# Patient Record
Sex: Male | Born: 1970 | Race: White | Hispanic: No | Marital: Married | Smoking: Former smoker
Health system: Southern US, Community
[De-identification: ages and names within clinical notes are randomized; demographics above are authoritative.]

## PROBLEM LIST (undated history)

## (undated) DIAGNOSIS — F32A Depression, unspecified: Secondary | ICD-10-CM

## (undated) DIAGNOSIS — M542 Cervicalgia: Secondary | ICD-10-CM

## (undated) DIAGNOSIS — M549 Dorsalgia, unspecified: Secondary | ICD-10-CM

## (undated) DIAGNOSIS — M1990 Unspecified osteoarthritis, unspecified site: Secondary | ICD-10-CM

## (undated) DIAGNOSIS — I10 Essential (primary) hypertension: Secondary | ICD-10-CM

## (undated) HISTORY — DX: Depression, unspecified: F32.A

## (undated) HISTORY — PX: NECK SURGERY: SHX720

---
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
## 1999-10-18 ENCOUNTER — Encounter: Payer: Self-pay | Admitting: Neurosurgery

## 1999-10-18 ENCOUNTER — Inpatient Hospital Stay (HOSPITAL_COMMUNITY): Admission: EM | Admit: 1999-10-18 | Discharge: 1999-10-22 | Payer: Self-pay | Admitting: Emergency Medicine

## 1999-10-18 ENCOUNTER — Encounter: Payer: Self-pay | Admitting: Emergency Medicine

## 1999-10-19 ENCOUNTER — Encounter: Payer: Self-pay | Admitting: Neurosurgery

## 2000-03-11 ENCOUNTER — Emergency Department (HOSPITAL_COMMUNITY): Admission: EM | Admit: 2000-03-11 | Discharge: 2000-03-11 | Payer: Self-pay

## 2000-03-11 ENCOUNTER — Encounter: Payer: Self-pay | Admitting: Emergency Medicine

## 2000-03-12 ENCOUNTER — Encounter: Payer: Self-pay | Admitting: Emergency Medicine

## 2003-02-10 ENCOUNTER — Emergency Department (HOSPITAL_COMMUNITY): Admission: EM | Admit: 2003-02-10 | Discharge: 2003-02-10 | Payer: Self-pay

## 2003-02-17 ENCOUNTER — Emergency Department (HOSPITAL_COMMUNITY): Admission: EM | Admit: 2003-02-17 | Discharge: 2003-02-17 | Payer: Self-pay | Admitting: Emergency Medicine

## 2003-11-14 ENCOUNTER — Emergency Department (HOSPITAL_COMMUNITY): Admission: AD | Admit: 2003-11-14 | Discharge: 2003-11-14 | Payer: Self-pay | Admitting: Family Medicine

## 2004-04-06 ENCOUNTER — Ambulatory Visit (HOSPITAL_COMMUNITY): Admission: RE | Admit: 2004-04-06 | Discharge: 2004-04-06 | Payer: Self-pay | Admitting: Neurosurgery

## 2004-10-03 ENCOUNTER — Ambulatory Visit: Payer: Self-pay | Admitting: Family Medicine

## 2004-10-08 ENCOUNTER — Encounter: Admission: RE | Admit: 2004-10-08 | Discharge: 2004-10-08 | Payer: Self-pay | Admitting: Family Medicine

## 2004-11-05 ENCOUNTER — Ambulatory Visit: Payer: Self-pay | Admitting: Family Medicine

## 2004-12-03 ENCOUNTER — Ambulatory Visit: Payer: Self-pay | Admitting: Family Medicine

## 2004-12-31 ENCOUNTER — Ambulatory Visit: Payer: Self-pay | Admitting: Family Medicine

## 2005-01-14 ENCOUNTER — Ambulatory Visit: Payer: Self-pay | Admitting: Family Medicine

## 2005-01-23 ENCOUNTER — Ambulatory Visit: Payer: Self-pay | Admitting: Family Medicine

## 2005-02-04 ENCOUNTER — Inpatient Hospital Stay (HOSPITAL_COMMUNITY): Admission: RE | Admit: 2005-02-04 | Discharge: 2005-02-07 | Payer: Self-pay | Admitting: Neurosurgery

## 2005-03-04 ENCOUNTER — Ambulatory Visit: Payer: Self-pay | Admitting: Family Medicine

## 2005-05-06 ENCOUNTER — Ambulatory Visit: Payer: Self-pay | Admitting: Family Medicine

## 2005-05-08 ENCOUNTER — Ambulatory Visit (HOSPITAL_COMMUNITY): Admission: RE | Admit: 2005-05-08 | Discharge: 2005-05-08 | Payer: Self-pay | Admitting: Family Medicine

## 2005-07-25 ENCOUNTER — Ambulatory Visit: Payer: Self-pay | Admitting: Family Medicine

## 2005-07-25 ENCOUNTER — Ambulatory Visit (HOSPITAL_COMMUNITY): Admission: RE | Admit: 2005-07-25 | Discharge: 2005-07-25 | Payer: Self-pay | Admitting: Family Medicine

## 2005-07-28 ENCOUNTER — Ambulatory Visit: Payer: Self-pay | Admitting: Family Medicine

## 2005-07-29 ENCOUNTER — Ambulatory Visit: Payer: Self-pay | Admitting: Sports Medicine

## 2005-08-05 ENCOUNTER — Ambulatory Visit: Payer: Self-pay | Admitting: Family Medicine

## 2005-08-07 ENCOUNTER — Ambulatory Visit (HOSPITAL_COMMUNITY): Admission: RE | Admit: 2005-08-07 | Discharge: 2005-08-07 | Payer: Self-pay | Admitting: Family Medicine

## 2005-09-25 ENCOUNTER — Ambulatory Visit: Payer: Self-pay | Admitting: Family Medicine

## 2005-10-30 ENCOUNTER — Ambulatory Visit: Payer: Self-pay | Admitting: Family Medicine

## 2005-11-10 ENCOUNTER — Emergency Department (HOSPITAL_COMMUNITY): Admission: EM | Admit: 2005-11-10 | Discharge: 2005-11-10 | Payer: Self-pay | Admitting: Emergency Medicine

## 2005-12-16 ENCOUNTER — Ambulatory Visit: Payer: Self-pay | Admitting: Family Medicine

## 2006-02-07 IMAGING — CR DG ANKLE COMPLETE 3+V*L*
3 series · 3 of 3 positions shown · non-contrast
Comparison: none

CLINICAL DATA: 34-year-old, with lateral ankle pain. 
 3-VIEW LEFT ANKLE:

[t ankle joint ap left]
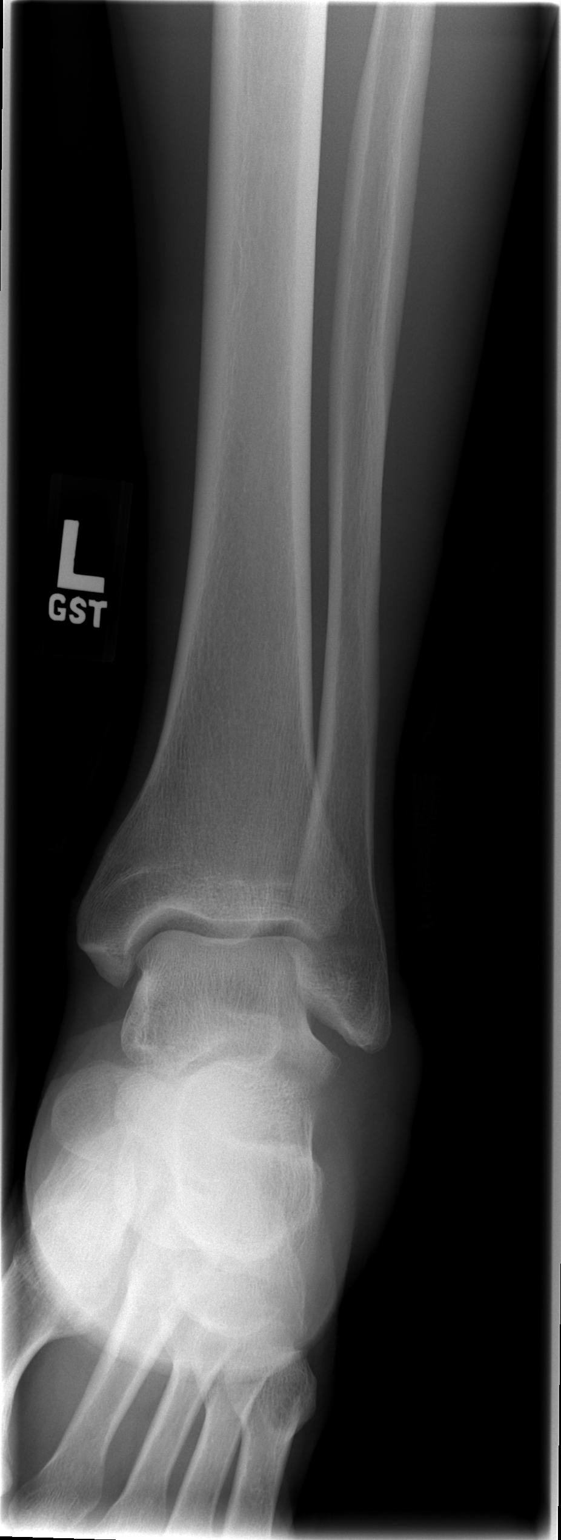

[t ankle joint oblique left]
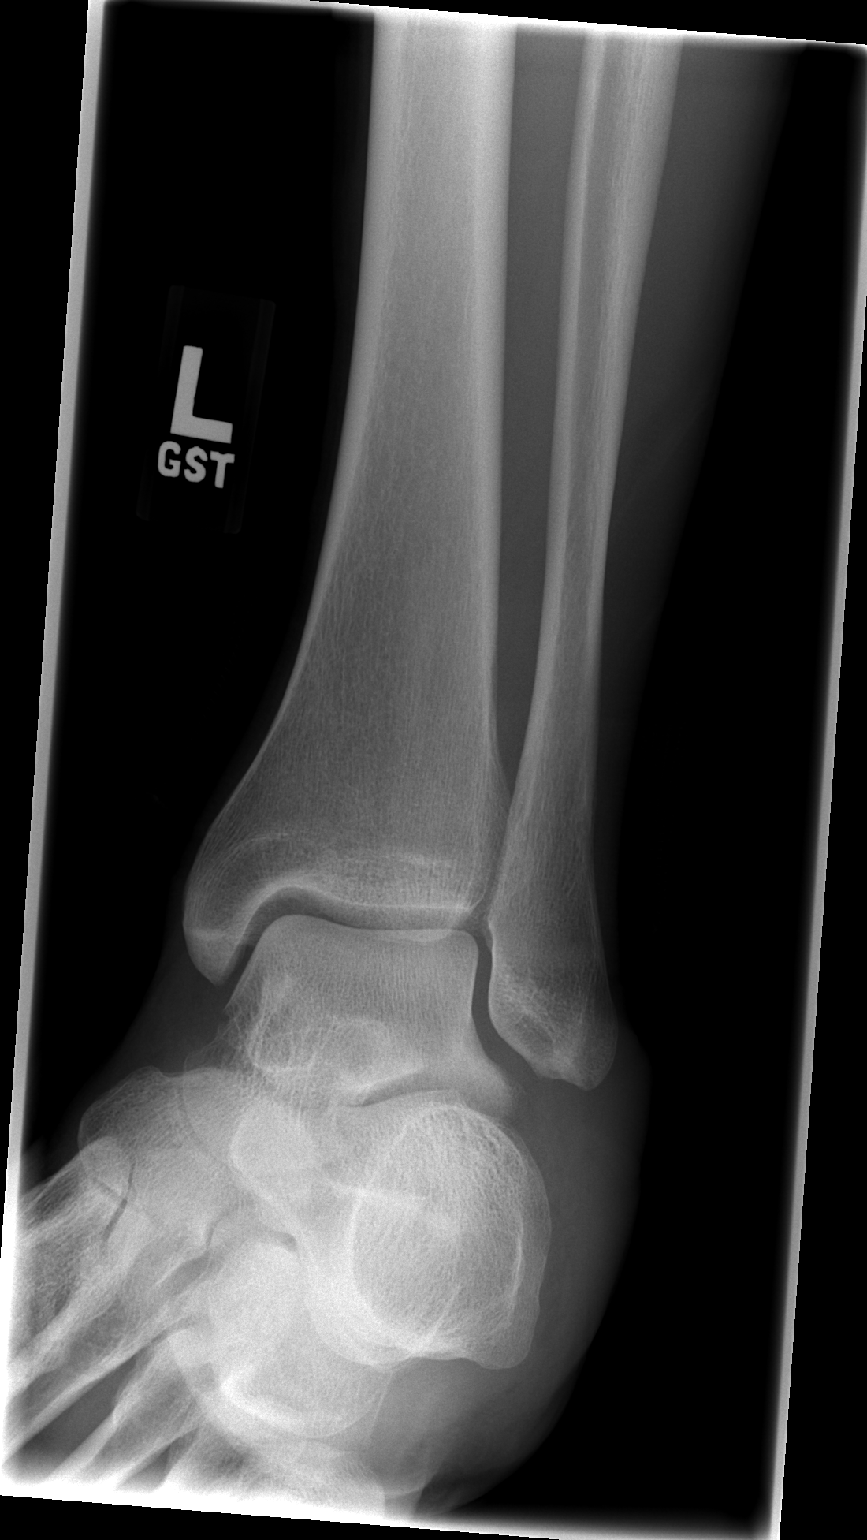

[t ankle joint lat left]
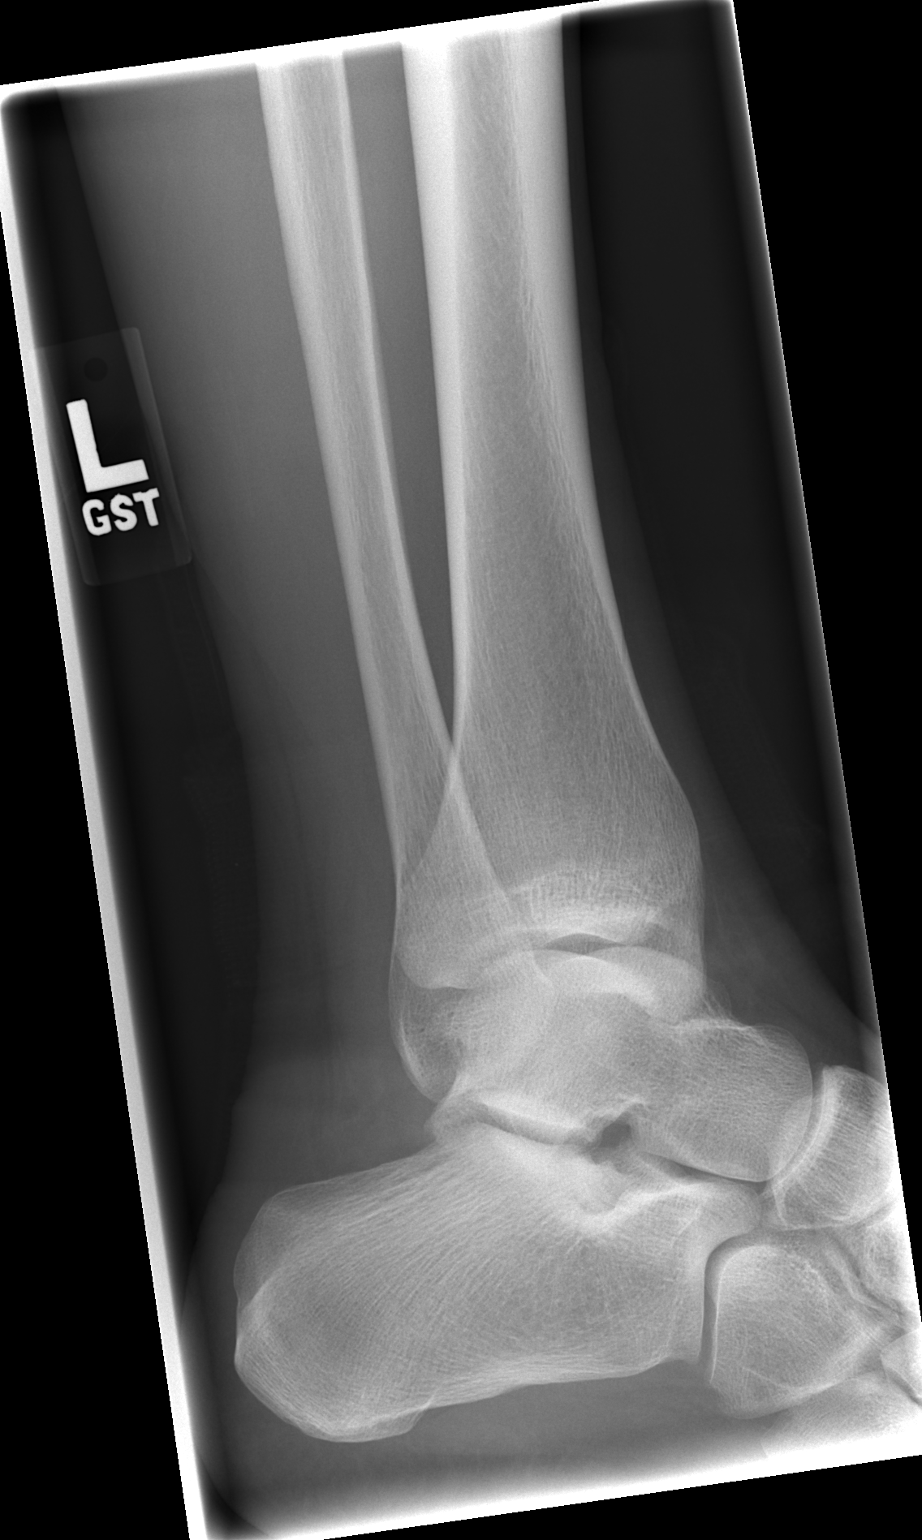

[3 of 3 positions shown; findings below may reference images not displayed]

FINDINGS: Ankle mortise is maintained.  No fractures are seen.
IMPRESSION: No acute bony findings.

## 2006-02-26 ENCOUNTER — Ambulatory Visit: Payer: Self-pay | Admitting: Family Medicine

## 2006-06-16 ENCOUNTER — Ambulatory Visit: Payer: Self-pay | Admitting: Sports Medicine

## 2006-06-30 ENCOUNTER — Ambulatory Visit: Payer: Self-pay | Admitting: Sports Medicine

## 2006-07-21 ENCOUNTER — Ambulatory Visit: Payer: Self-pay | Admitting: Family Medicine

## 2006-09-24 ENCOUNTER — Ambulatory Visit: Payer: Self-pay | Admitting: Family Medicine

## 2007-02-25 DIAGNOSIS — M479 Spondylosis, unspecified: Secondary | ICD-10-CM

## 2007-02-25 DIAGNOSIS — K219 Gastro-esophageal reflux disease without esophagitis: Secondary | ICD-10-CM

## 2007-02-25 DIAGNOSIS — F329 Major depressive disorder, single episode, unspecified: Secondary | ICD-10-CM

## 2007-02-25 DIAGNOSIS — F411 Generalized anxiety disorder: Secondary | ICD-10-CM | POA: Insufficient documentation

## 2007-02-25 DIAGNOSIS — G43909 Migraine, unspecified, not intractable, without status migrainosus: Secondary | ICD-10-CM

## 2007-02-25 HISTORY — DX: Spondylosis, unspecified: M47.9

## 2007-02-25 HISTORY — DX: Migraine, unspecified, not intractable, without status migrainosus: G43.909

## 2007-02-25 HISTORY — DX: Gastro-esophageal reflux disease without esophagitis: K21.9

## 2007-04-13 ENCOUNTER — Ambulatory Visit: Payer: Self-pay | Admitting: Family Medicine

## 2007-04-13 DIAGNOSIS — L0293 Carbuncle, unspecified: Secondary | ICD-10-CM

## 2007-04-13 DIAGNOSIS — L0292 Furuncle, unspecified: Secondary | ICD-10-CM | POA: Insufficient documentation

## 2007-04-13 HISTORY — DX: Furuncle, unspecified: L02.92

## 2007-04-14 ENCOUNTER — Telehealth: Payer: Self-pay | Admitting: *Deleted

## 2007-11-22 ENCOUNTER — Ambulatory Visit: Payer: Self-pay | Admitting: Family Medicine

## 2007-12-06 ENCOUNTER — Emergency Department (HOSPITAL_COMMUNITY): Admission: EM | Admit: 2007-12-06 | Discharge: 2007-12-06 | Payer: Self-pay | Admitting: Emergency Medicine

## 2008-01-31 ENCOUNTER — Ambulatory Visit: Payer: Self-pay | Admitting: Family Medicine

## 2008-02-18 ENCOUNTER — Encounter: Payer: Self-pay | Admitting: Family Medicine

## 2008-05-04 ENCOUNTER — Encounter (INDEPENDENT_AMBULATORY_CARE_PROVIDER_SITE_OTHER): Payer: Self-pay | Admitting: *Deleted

## 2008-05-05 ENCOUNTER — Telehealth: Payer: Self-pay | Admitting: Family Medicine

## 2008-05-05 ENCOUNTER — Ambulatory Visit: Payer: Self-pay | Admitting: Family Medicine

## 2008-06-01 ENCOUNTER — Ambulatory Visit: Payer: Self-pay | Admitting: Family Medicine

## 2008-06-26 ENCOUNTER — Ambulatory Visit: Payer: Self-pay | Admitting: Family Medicine

## 2008-06-26 DIAGNOSIS — R221 Localized swelling, mass and lump, neck: Secondary | ICD-10-CM

## 2008-06-26 DIAGNOSIS — R220 Localized swelling, mass and lump, head: Secondary | ICD-10-CM

## 2008-07-12 ENCOUNTER — Emergency Department (HOSPITAL_COMMUNITY): Admission: EM | Admit: 2008-07-12 | Discharge: 2008-07-12 | Payer: Self-pay | Admitting: Emergency Medicine

## 2008-09-08 ENCOUNTER — Encounter: Payer: Self-pay | Admitting: Family Medicine

## 2008-10-26 ENCOUNTER — Telehealth: Payer: Self-pay | Admitting: Family Medicine

## 2009-04-27 ENCOUNTER — Emergency Department (HOSPITAL_COMMUNITY): Admission: EM | Admit: 2009-04-27 | Discharge: 2009-04-27 | Payer: Self-pay | Admitting: Emergency Medicine
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
## 2011-05-16 NOTE — Op Note (Signed)
NAMEJAYMESON, Rick [name]               ACCOUNT NO.:  [id number]   MEDICAL RECORD NO.:  [id number]          PATIENT TYPE:  INP   LOCATION:  2899                         FACILITY:  [facility]   PHYSICIAN:  Payton Doughty, M.D.      DATE OF BIRTH:  1971/10/10   DATE OF PROCEDURE:  02/04/2005  DATE OF DISCHARGE:                                 OPERATIVE REPORT   PREOPERATIVE DIAGNOSIS:  Nonunion, C6-7 spondylosis, C4-5, C5-6.   POSTOPERATIVE DIAGNOSIS:  Nonunion, C6-7 spondylosis, C4-5, C5-6.   OPERATIVE PROCEDURE:  C4-5, C5-6, C6-7 anterior cervical decompression and  fusion with a reflex Hybrid plate.   SERVICE:  Neurosurgery.   ANESTHESIA:  Endotracheal.   PREPARATION:  __________ alcohol wipe.   COMPLICATIONS:  None.   ASSISTANT:  Danae Orleans. Venetia Maxon, M.D.   DESCRIPTION OF OPERATION:  A 40 year old gentleman with severe cervical  spondylosis at C4-5 and C5-6 and a possible nonunion at C6-7.  He was taken  to the operating room, smoothly anesthetized and intubated, and placed  supine on the operating table in the Holter head traction.  Following shave,  prep, and drape in the usual sterile fashion, the skin was incised  paralleling the sternocleidomastoid muscle starting 2 fingerbreadths below  the carotid tubercle and extending in a cephalic direction approximately 8  cm.  The platysma was identified, elevated, divided, and undermined.  The  sternocleidomastoid was identified and medial dissection revealed the  carotid artery which we retracted laterally to the left.  Trachea and  esophagus were retracted laterally to the right.  The bones of the anterior  cervical spine were exposed.  The old scar was easily dissected off the  bone.  The esophagus was carefully protected and no injury to it was  incurred.  Marker was placed.  Intraoperative x-ray was taken to confirm  correctness of level.  On confirm of correctness of level, longus colli was  taken down bilaterally and the Shadowline  self-retaining retractor placed in  a transverse and cephalic caudal direction.  Diskectomy was then carried out  at C4-5 and C5-6 under gross observation.  The operating microscope was then  brought in.  We used microdissection technique to remove the remaining disk  at C4-5 and C5-6, removed the posterior longitudinal ligament and dissected  the lateral recesses and the neural foramina.  At C6-7, general curettage  and the [facility] pituitary forceps were used to remove the bone that had not  fused.  There was a fibrous union but no bony union.  The anterior epidural  space was encountered.  No CSF leak was incurred.  Bony osteophytes were  removed and the neuroforamen explored and found to be opened.  Then, 7 mm  bone grafts were fashioned for the patella allograft and tapped into place  at all three levels.  A three-level, cervical 57 mm plate was then placed.  Two screws were placed in C4, two in C5, two in C6, two in C7.  They were  self-drilling 12 mm screws.  Intraoperative x-ray showed good placement of  bone grafts, plate, and screws.  The wound was irrigated, hemostasis  ensured.  A 7 mm Jackson-Pratt drain was exited by a separate incision.  The  platysma was reapproximated with 0 Vicryl in interrupted fashion.  Subcutaneous tissues  were reapproximated with 0 Vicryl in interrupted fashion.  Skin was closed  with 4-0 Vicryl in a running subcuticular fashion.  Benzoin and Steri-Strips  were placed and made occlusive with Telfa and Op-Site, and the patient  placed in an Aspen collar and returned to the recovery room in good  condition.      MWR/MEDQ  D:  02/04/2005  T:  02/04/2005  Job:  [id number]

## 2011-07-24 ENCOUNTER — Emergency Department (HOSPITAL_COMMUNITY)
Admission: EM | Admit: 2011-07-24 | Discharge: 2011-07-24 | Disposition: A | Discharge status: Home / Self Care | Payer: Self-pay | Attending: Emergency Medicine | Admitting: Emergency Medicine

## 2011-07-24 ENCOUNTER — Emergency Department (HOSPITAL_COMMUNITY): Payer: Self-pay

## 2011-07-24 DIAGNOSIS — M546 Pain in thoracic spine: Secondary | ICD-10-CM | POA: Insufficient documentation

## 2011-07-24 DIAGNOSIS — M79609 Pain in unspecified limb: Secondary | ICD-10-CM | POA: Insufficient documentation

## 2011-07-24 DIAGNOSIS — M542 Cervicalgia: Secondary | ICD-10-CM | POA: Insufficient documentation

## 2011-07-24 DIAGNOSIS — X500XXA Overexertion from strenuous movement or load, initial encounter: Secondary | ICD-10-CM | POA: Insufficient documentation

## 2011-07-24 DIAGNOSIS — S139XXA Sprain of joints and ligaments of unspecified parts of neck, initial encounter: Secondary | ICD-10-CM | POA: Insufficient documentation

## 2011-08-01 ENCOUNTER — Emergency Department (HOSPITAL_COMMUNITY): Payer: Worker's Compensation

## 2011-08-01 ENCOUNTER — Emergency Department (HOSPITAL_COMMUNITY)
Admission: EM | Admit: 2011-08-01 | Discharge: 2011-08-01 | Disposition: A | Discharge status: Home / Self Care | Payer: Worker's Compensation | Attending: Emergency Medicine | Admitting: Emergency Medicine

## 2011-08-01 DIAGNOSIS — Y9289 Other specified places as the place of occurrence of the external cause: Secondary | ICD-10-CM | POA: Insufficient documentation

## 2011-08-01 DIAGNOSIS — M545 Low back pain, unspecified: Secondary | ICD-10-CM | POA: Insufficient documentation

## 2011-08-01 DIAGNOSIS — S01409A Unspecified open wound of unspecified cheek and temporomandibular area, initial encounter: Secondary | ICD-10-CM | POA: Insufficient documentation

## 2011-08-01 DIAGNOSIS — W1809XA Striking against other object with subsequent fall, initial encounter: Secondary | ICD-10-CM | POA: Insufficient documentation

## 2011-08-01 DIAGNOSIS — R51 Headache: Secondary | ICD-10-CM | POA: Insufficient documentation

## 2011-08-01 DIAGNOSIS — I10 Essential (primary) hypertension: Secondary | ICD-10-CM | POA: Insufficient documentation

## 2011-08-01 DIAGNOSIS — M542 Cervicalgia: Secondary | ICD-10-CM | POA: Insufficient documentation

## 2011-08-01 DIAGNOSIS — S0990XA Unspecified injury of head, initial encounter: Secondary | ICD-10-CM | POA: Insufficient documentation

## 2012-07-02 ENCOUNTER — Other Ambulatory Visit: Payer: Self-pay | Admitting: Family Medicine

## 2014-12-26 ENCOUNTER — Encounter (HOSPITAL_COMMUNITY): Payer: Self-pay | Admitting: *Deleted

## 2014-12-26 ENCOUNTER — Emergency Department (HOSPITAL_COMMUNITY): Payer: Self-pay

## 2014-12-26 ENCOUNTER — Emergency Department (HOSPITAL_COMMUNITY)
Admission: EM | Admit: 2014-12-26 | Discharge: 2014-12-26 | Disposition: A | Discharge status: Home / Self Care | Payer: Self-pay | Attending: Emergency Medicine | Admitting: Emergency Medicine

## 2014-12-26 DIAGNOSIS — T1490XA Injury, unspecified, initial encounter: Secondary | ICD-10-CM

## 2014-12-26 DIAGNOSIS — Z79899 Other long term (current) drug therapy: Secondary | ICD-10-CM | POA: Insufficient documentation

## 2014-12-26 DIAGNOSIS — L989 Disorder of the skin and subcutaneous tissue, unspecified: Secondary | ICD-10-CM | POA: Insufficient documentation

## 2014-12-26 DIAGNOSIS — Y9289 Other specified places as the place of occurrence of the external cause: Secondary | ICD-10-CM | POA: Insufficient documentation

## 2014-12-26 DIAGNOSIS — Z792 Long term (current) use of antibiotics: Secondary | ICD-10-CM | POA: Insufficient documentation

## 2014-12-26 DIAGNOSIS — Y998 Other external cause status: Secondary | ICD-10-CM | POA: Insufficient documentation

## 2014-12-26 DIAGNOSIS — S62307A Unspecified fracture of fifth metacarpal bone, left hand, initial encounter for closed fracture: Secondary | ICD-10-CM | POA: Insufficient documentation

## 2014-12-26 DIAGNOSIS — Z720 Tobacco use: Secondary | ICD-10-CM | POA: Insufficient documentation

## 2014-12-26 DIAGNOSIS — Y9389 Activity, other specified: Secondary | ICD-10-CM | POA: Insufficient documentation

## 2014-12-26 DIAGNOSIS — W228XXA Striking against or struck by other objects, initial encounter: Secondary | ICD-10-CM | POA: Insufficient documentation

## 2014-12-26 HISTORY — DX: Dorsalgia, unspecified: M54.9

## 2014-12-26 MED ORDER — HYDROCODONE-ACETAMINOPHEN 5-325 MG PO TABS
1.0000 | ORAL_TABLET | Freq: Once | ORAL | Status: AC
  Administered 2014-12-26: 1 via ORAL
  Filled 2014-12-26: qty 1

## 2014-12-26 MED ORDER — HYDROCODONE-ACETAMINOPHEN 5-325 MG PO TABS: 1.0000 | ORAL_TABLET | ORAL | Status: DC | PRN

## 2014-12-26 NOTE — Discharge Instructions (Signed)
Read the information below.  Use the prescribed medication as directed.  Please discuss all new medications with your pharmacist.  Do not take additional tylenol while taking the prescribed pain medication to avoid overdose.  You may return to the Emergency Department at any time for worsening condition or any new symptoms that concern you.  If you develop uncontrolled pain, weakness or numbness of the extremity, severe discoloration of the skin, or you are unable to move your fingers, return to the ER for a recheck.       Boxer's Fracture You have a break (fracture) of the fifth metacarpal bone. This is commonly called a boxer's fracture. This is the bone in the hand where the Probert finger attaches. The fracture is in the end of that bone, closest to the Lorah finger. It is usually caused when you hit an object with a clenched fist. Often, the knuckle is pushed down by the impact. Sometimes, the fracture rotates out of position. A boxer's fracture will usually heal within 6 weeks, if it is treated properly and protected from re-injury. Surgery is sometimes needed. A cast, splint, or bulky hand dressing may be used to protect and immobilize a boxer's fracture. Do not remove this device or dressing until your caregiver approves. Keep your hand elevated, and apply ice packs for 15-20 minutes every 2 hours, for the first 2 days. Elevation and ice help reduce swelling and relieve pain. See your caregiver, or an orthopedic specialist, for follow-up care within the next 10 days. This is to make sure your fracture is healing properly. Document Released: 12/15/2005 Document Revised: 03/08/2012 Document Reviewed: 06/04/2007 [facility] Patient Information 2015 [place], [place]. This information is not intended to replace advice given to you by your health care provider. Make sure you discuss any questions you have with your health care provider.  Cast Care The purpose of a cast is to protect and immobilize an  injured part of the body. This may be necessary after fractures, surgery, or other injuries. Splints are another form of immobilization; however, they are usually not as rigid as a cast, which accommodates swelling of the injury while still maintaining immobilization. Splints are typically used in the immediate post injury or postoperative period, before changing to a cast.  Before the rigid material of a cast is formed, a layer of padding is first applied to protect the injury. The rigid portion of a cast is created by wrapping gauze saturated with plaster of paris around the injury; alternatively, the cast may be made of fiberglass. During the period of immobilization, a cast may need to be changed multiple times. The length of immobilization is dependent on the severity of the injury and the time needed for healing. This period can vary from a couple weeks to many months. After a cast is created, radiographs (X-rays) through the cast determine if a satisfactory alignment of the bones was achieved. Radiographs may also be used throughout the healing period to check for signs of bone healing.  CARE OF THE CAST   Allow the cast to dry and harden completely before applying any pressure to it.  Applying pressure too early may create a point of excessive pressure on the skin, which may increase the risk of forming an ulcer. Drying time depends on the type of cast but may last as long as 24 hours.  When a cast gets wet, a soft area may appear. If this happens accidentally, return to the doctor's office, emergency room, or outpatient  surgical facility as soon as possible for repairs or changing of the cast.  Do not get sand in the cast.  Do not place anything inside the cast. This includes items intended to scratch an area of skin that itches. ITCHING INSIDE A CAST   It is very common for a person with a cast to experience an itching sensation under the cast.  Do not scratch any area under the cast even  if the area is within reach. The skin under a cast in an environment of increased risk for injury.  Do not put anything in the cast.  If there is no wound, such as an incision from surgery, you may sprinkle cornstarch into the cast to relieve itching.  If a wound is present under the cast, consult your caregiver for pain medication or medication to reduce itching.  Using a hairdryer (on the cold setting) is a useful technique for reducing an itching sensation. CARE OF THE PATIENT IN A CAST It is important to elevate the body part that is in a cast to a level equal to or above that of the heart whenever possible. Elevating the injured body part may reduce the likelihood of swelling. Elevation of a leg in a cast may be achieved by resting the leg on a pillow when in bed and on a footstool or chair when sitting. For an arm in a cast, rest the arm on a pillow placed on the chest.  No matter how well one follows the necessary precautions, excessive swelling may occur under the cast. Signs and symptoms of excessive swelling include:  Severe and persistent pain.  Change in color of the tissues beyond the end of the cast, such as a change to blue or gray under the nails of the fingers or toes.  Coldness of the tissues beyond the cast when the rest of the body is warm.  Numbness or complete loss of feeling in the skin beyond the cast.  Feeling of tightness under the cast after it dries.  Swelling of the tissue to a greater extent than was present before the cast was applied.  For a leg cast, inability to raise the big toe. If any of these signs or symptoms occur, contact your caregiver or an emergency room as soon as possible for treatment.  Infection Inside a Cast On occasion, an injury may become infected during healing. The most important way to fight an infection is to detect it early; however, early detection may be difficult if the infected area is covered by a cast. Infection should be  reported immediately to your caregiver. The following are common signs and symptoms of infection:   Foul smell.  Fever greater than 101F (38.3C) (may be accompanied by a general ill feeling).  Leakage of fluid through the cast.  Increasing pain or soreness of the skin under the cast. Bathing with a Cast Bathing is often a difficult task with a cast. The cast must be kept dry at all times, unless otherwise specified by your caregiver. If the cast is on a limb, such as your arm or leg, it is often easier to take a bath with the extremity in a cast propped up on the side of the tub or a chair, out of the water. If the cast is on the trunk of the body, you should take sponge baths until the cast is removed.  Document Released: 12/15/2005 Document Revised: 05/01/2014 Document Reviewed: 03/29/2009 [facility] Patient Information 2015 [place], [place]LL[place]. This information  is not intended to replace advice given to you by your health care provider. Make sure you discuss any questions you have with your health care provider.

## 2014-12-26 NOTE — ED Notes (Signed)
Ortho at bedside.

## 2014-12-26 NOTE — ED Notes (Signed)
Pt in c/o injury to his left hand after punching a refrigerator, also c/o possible abscess to his right lower jawline that first was noted a year ago, no distress noted

## 2014-12-26 NOTE — Progress Notes (Signed)
Orthopedic Tech Progress Note Patient Details:  Rick [name] 08-Sep-1971 [id number] Applied fiberglass ulnar gutter splint to LUE.  Pulses, sensation, motion intact before and after application.  Capillary refill less than 2 seconds before and after application. Ortho Devices Type of Ortho Device: Ulna gutter splint Ortho Device/Splint Location: LUE Ortho Device/Splint Interventions: Application   Lesle ChrisGilliland, Hinley Brimage L 12/26/2014, 7:35 PM

## 2014-12-26 NOTE — ED Provider Notes (Signed)
CSN: [id number]     Arrival date & time 12/26/14  1602 History  This chart was scribed for non-physician practitioner, Trixie DredgeEmily Tiffanie Blassingame, PA-C, working with Loren Raceravid Yelverton, MD, by Bronson CurbJacqueline Melvin, ED Scribe. This patient was seen in room TR08C/TR08C and the patient's care was started at 5:40 PM.     Chief Complaint  Patient presents with  . Hand Pain  . Abscess    The history is provided by the patient. No language interpreter was used.     HPI Comments: Rick [name] is a 43 y.o. male, with history of back pain, who presents to the Emergency Department complaining of sharp, throbbing, 8/10 left hand pain that began after punching a refrigerator approximately 7 hours ago.  Pain does not radiate.  Patient reports 4-5 boxer's fractures in the same hand. There is associated swelling to the left hand and wrist. He denies any other injuries. Denies weakness or numbness of the hand except that he feels unable to flex the 5th MCP.   Patient also notes swelling to his right lower jawline for the past year that has became painful in the last 4-6 weeks. He denies drainage. NKA to any medications.  Denies dental pain, fevers.    Past Medical History  Diagnosis Date  . Back pain    History reviewed. No pertinent past surgical history. History reviewed. No pertinent family history. History  Substance Use Topics  . Smoking status: Current Every Day Smoker  . Smokeless tobacco: Not on file  . Alcohol Use: Not on file    Review of Systems  HENT: Positive for facial swelling. Negative for dental problem, sore throat and trouble swallowing.   Musculoskeletal: Positive for myalgias (left hand) and arthralgias (left wrist).  Skin: Negative for color change and wound.  Allergic/Immunologic: Negative for immunocompromised state.  Hematological: Does not bruise/bleed easily.      Allergies  Review of patient's allergies indicates no known allergies.  Home Medications   Prior to Admission  medications   Medication Sig Start Date End Date Taking? Authorizing Provider  cyclobenzaprine (FLEXERIL) 10 MG tablet Take 10 mg by mouth 2 (two) times daily as needed.      Historical Provider, MD  doxycycline (VIBRAMYCIN) 100 MG capsule Take 1 tab twice a day for 10 days then 1 a day for a month     Historical Provider, MD  hexachlorophene (PHISOHEX) 3 % liquid use daily for 3 days each month - 1 bottle     Historical Provider, MD  metoprolol (TOPROL-XL) 50 MG 24 hr tablet Take 50 mg by mouth daily.      Historical Provider, MD  mupirocin (BACTROBAN) 2 % cream Apply topically 3 (three) times daily. to skin lesions     Historical Provider, MD  sertraline (ZOLOFT) 100 MG tablet Take 100 mg by mouth daily.      Historical Provider, MD   Triage Vitals: BP 148/98 mmHg  Pulse 118  Temp(Src) 98.3 F (36.8 C) (Oral)  Resp 20  Ht 5\' 10"  (1.778 m)  Wt 160 lb (72.576 kg)  BMI 22.96 kg/m2  SpO2 100%  Physical Exam  Constitutional: He appears well-developed and well-nourished. No distress.  HENT:  Head: Normocephalic and atraumatic.  Right lower gingival without erythema, edema, or tenderness, discharge. There is a mobile cyst over the right mandible.    Neck: Neck supple.  Pulmonary/Chest: Effort normal.  Musculoskeletal: He exhibits edema.  Left wrist is nontender. Edema and mild ecchymosis over the 4th and  5th metacarpal dorsally. Distal sensation intact. Cap refill <2 seconds. Full active ROM in all digits except for the 5th. 5th finger is decreased at the MCP.  Neurological: He is alert.  Skin: He is not diaphoretic.  Nursing note and vitals reviewed.   ED Course  Procedures (including critical care time)  DIAGNOSTIC STUDIES: Oxygen Saturation is 100% on room air, normal by my interpretation.    COORDINATION OF CARE: At 1747 Discussed treatment plan with patient which includes pain medication and ice. Patient agrees.   Labs Review Labs Reviewed - No data to display  Imaging  Review Dg Hand Complete Left  12/26/2014   CLINICAL DATA:  Acute left hand pain following injury.  EXAM: LEFT HAND - COMPLETE 3+ VIEW  COMPARISON:  None.  FINDINGS: An oblique fracture of the distal fifth metacarpal is noted with apex dorsal angulation.  There is no evidence of subluxation or dislocation.  No other bony abnormalities are present.  IMPRESSION: Angulated distal fifth metacarpal fracture.   Electronically Signed   By: Laveda AbbeJeff  Hu M.D.   On: 12/26/2014 18:06     EKG Interpretation None      MDM   Final diagnoses:  Fracture of fifth metacarpal bone of left hand, closed, initial encounter  Facial lesion   Afebrile, nontoxic patient with right boxers fracture.  Neurovascularly intact.  Placed in ulnar gutter splint. D/c with Dr Merlyn LotKuzma (hand) f/u, norco.  Right face with mobile cystic lesion.  No e/o infection.  Pt advised to follow up with dermatology for definitive diagnosis and treatment.   Discussed result, findings, treatment, and follow up  with patient.  Pt given return precautions.  Pt verbalizes understanding and agrees with plan.       I personally performed the services described in this documentation, which was scribed in my presence. The recorded information has been reviewed and is accurate.    Trixie Dredgemily Kikuye Korenek, PA-C 12/26/14 1915  Loren Raceravid Yelverton, MD 12/26/14 2337

## 2016-03-14 ENCOUNTER — Emergency Department (HOSPITAL_COMMUNITY)
Admission: EM | Admit: 2016-03-14 | Discharge: 2016-03-14 | Disposition: A | Discharge status: Home / Self Care | Payer: Self-pay | Attending: Emergency Medicine | Admitting: Emergency Medicine

## 2016-03-14 ENCOUNTER — Encounter (HOSPITAL_COMMUNITY): Payer: Self-pay

## 2016-03-14 DIAGNOSIS — R197 Diarrhea, unspecified: Secondary | ICD-10-CM | POA: Insufficient documentation

## 2016-03-14 DIAGNOSIS — R0602 Shortness of breath: Secondary | ICD-10-CM | POA: Insufficient documentation

## 2016-03-14 DIAGNOSIS — R112 Nausea with vomiting, unspecified: Secondary | ICD-10-CM | POA: Insufficient documentation

## 2016-03-14 DIAGNOSIS — F17210 Nicotine dependence, cigarettes, uncomplicated: Secondary | ICD-10-CM | POA: Insufficient documentation

## 2016-03-14 DIAGNOSIS — R079 Chest pain, unspecified: Secondary | ICD-10-CM | POA: Insufficient documentation

## 2016-03-14 DIAGNOSIS — R42 Dizziness and giddiness: Secondary | ICD-10-CM | POA: Insufficient documentation

## 2016-03-14 LAB — CBC WITH DIFFERENTIAL/PLATELET
BASOS ABS: 0 10*3/uL (ref 0.0–0.1)
Basophils Relative: 0 %
EOS PCT: 1 %
Eosinophils Absolute: 0.1 10*3/uL (ref 0.0–0.7)
HCT: 42.7 % (ref 39.0–52.0)
Hemoglobin: 15.2 g/dL (ref 13.0–17.0)
Lymphocytes Relative: 9 %
Lymphs Abs: 1 10*3/uL (ref 0.7–4.0)
MCH: 30.3 pg (ref 26.0–34.0)
MCHC: 35.6 g/dL (ref 30.0–36.0)
MCV: 85.2 fL (ref 78.0–100.0)
MONO ABS: 0.4 10*3/uL (ref 0.1–1.0)
Monocytes Relative: 4 %
NEUTROS PCT: 86 %
Neutro Abs: 9.5 10*3/uL — ABNORMAL HIGH (ref 1.7–7.7)
PLATELETS: 229 10*3/uL (ref 150–400)
RBC: 5.01 MIL/uL (ref 4.22–5.81)
RDW: 12.3 % (ref 11.5–15.5)
WBC: 11 10*3/uL — AB (ref 4.0–10.5)

## 2016-03-14 LAB — COMPREHENSIVE METABOLIC PANEL
ALT: 20 U/L (ref 17–63)
ANION GAP: 13 (ref 5–15)
AST: 22 U/L (ref 15–41)
Albumin: 4.3 g/dL (ref 3.5–5.0)
Alkaline Phosphatase: 58 U/L (ref 38–126)
BUN: 6 mg/dL (ref 6–20)
CALCIUM: 10.1 mg/dL (ref 8.9–10.3)
CHLORIDE: 105 mmol/L (ref 101–111)
CO2: 19 mmol/L — ABNORMAL LOW (ref 22–32)
Creatinine, Ser: 1.14 mg/dL (ref 0.61–1.24)
GFR calc Af Amer: >60 mL/min (ref >60)
Glucose, Bld: 114 mg/dL — ABNORMAL HIGH (ref 65–99)
POTASSIUM: 3.7 mmol/L (ref 3.5–5.1)
Sodium: 137 mmol/L (ref 135–145)
Total Bilirubin: 0.9 mg/dL (ref 0.3–1.2)
Total Protein: 7 g/dL (ref 6.5–8.1)

## 2016-03-14 LAB — I-STAT CG4 LACTIC ACID, ED: Lactic Acid, Venous: 1.84 mmol/L (ref 0.5–2.0)

## 2016-03-14 NOTE — ED Notes (Signed)
Pt. Developed chest pain this am,  Squeezing  And non-radiating .  Chest pain presently is intermittent.   Ambulance went to pt. s house this am and his temp was 102.0  Pt. Took 650 mg Tylenol.  Pt. Is also having n/v d, Sob and dizziness.  Pt. Also had a fever last night but did not measure it.  Skin is warm and dry, pink,. ECG and Labs completed in Triage.

## 2016-03-14 NOTE — ED Notes (Signed)
Unable to find the pt. He is not in the waiting room, X-ray unable to find the pt.  Called X3

## 2016-03-14 NOTE — ED Notes (Signed)
Pt. Called X 3 No answer Pt. Cannot be found in the Waiting Room

## 2019-06-28 ENCOUNTER — Encounter (HOSPITAL_COMMUNITY): Payer: Self-pay | Admitting: Emergency Medicine

## 2019-06-28 ENCOUNTER — Other Ambulatory Visit: Payer: Self-pay

## 2019-06-28 ENCOUNTER — Emergency Department (HOSPITAL_COMMUNITY)
Admission: EM | Admit: 2019-06-28 | Discharge: 2019-06-28 | Disposition: A | Discharge status: Home / Self Care | Payer: Self-pay | Attending: Emergency Medicine | Admitting: Emergency Medicine

## 2019-06-28 DIAGNOSIS — G8929 Other chronic pain: Secondary | ICD-10-CM

## 2019-06-28 DIAGNOSIS — M545 Low back pain: Secondary | ICD-10-CM | POA: Insufficient documentation

## 2019-06-28 DIAGNOSIS — F17210 Nicotine dependence, cigarettes, uncomplicated: Secondary | ICD-10-CM | POA: Insufficient documentation

## 2019-06-28 DIAGNOSIS — Z79899 Other long term (current) drug therapy: Secondary | ICD-10-CM | POA: Insufficient documentation

## 2019-06-28 DIAGNOSIS — R202 Paresthesia of skin: Secondary | ICD-10-CM | POA: Insufficient documentation

## 2019-06-28 DIAGNOSIS — M542 Cervicalgia: Secondary | ICD-10-CM | POA: Insufficient documentation

## 2019-06-28 HISTORY — DX: Cervicalgia: M54.2

## 2019-06-28 MED ORDER — DICLOFENAC SODIUM 1 % TD GEL: 4.0000 g | Freq: Four times a day (QID) | TRANSDERMAL | 1 refills | Status: DC

## 2019-06-28 MED ORDER — GABAPENTIN 300 MG PO CAPS: 300.0000 mg | ORAL_CAPSULE | Freq: Three times a day (TID) | ORAL | 1 refills | Status: DC

## 2019-06-28 NOTE — ED Notes (Signed)
Patient verbalizes understanding of discharge instructions. Opportunity for questioning and answers were provided. Armband removed by staff, pt discharged from ED.  

## 2019-06-28 NOTE — Discharge Instructions (Signed)
May begin taking the gabapentin (generic for Neurontin) for nerve related pain.  May start with 300 mg twice daily and increased to using this medication 3 times daily.  Until you know how this medication will affect you, please avoid driving or other dangerous activities.  Diclofenac gel: This is a topical anti-inflammatory medication and can be applied directly to the painful region.  Do not use on the face or genitals.  This medication may be used as an alternative to oral anti-inflammatory medications, such as ibuprofen or naproxen.  Follow-up with neurosurgery as soon as possible on this matter for any further management.  Call the number provided to set up an appointment.
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
## 2019-06-28 NOTE — ED Provider Notes (Signed)
[facility] Provider Note   CSN: [id number] Arrival date & time: 06/28/19  1244    History   Chief Complaint Chief Complaint  Patient presents with  . Back Pain  . Neck Pain    HPI Rick [name] is a 48 y.o. male.     HPI   Rick [name] is a 48 y.o. male, with a history of chronic neck pain and chronic back pain, presenting to the ED with neck and back pain.  States he has had neck and back pain for several years, worse over the last several weeks.  His original pain was due to a serious fall that occurred at age 48.  He has been seen by a neurosurgeon in the past and has had at least 3 different neck and back surgeries, however, he has not seen a neurosurgeon in the past 15 years. Neck pain is midline neck, moderate to severe, aching and sometimes sharp, nonradiating. Back pain is lower lumbar region, midline, radiating throughout the lower back, moderate to severe. He is experiencing some paresthesias intermittently in the bilateral forearms and bilateral quadriceps. He has intermittently been taking ibuprofen for this issue.  Denies fever, difficulty breathing, difficulty swallowing, facial neuro deficits, weakness, acute fall/trauma, saddle anesthesias, true numbness, changes in bowel or bladder function, or any other complaints.   Past Medical History:  Diagnosis Date  . Back pain   . Neck pain     Patient Active Problem List   Diagnosis Date Noted  . NECK MASS 06/26/2008  . BOILS, RECURRENT 04/13/2007  . ANXIETY 02/25/2007  . DEPRESSIVE DISORDER, NOS 02/25/2007  . MIGRAINE, UNSPEC., W/O INTRACTABLE MIGRAINE 02/25/2007  . GASTROESOPHAGEAL REFLUX, NO ESOPHAGITIS 02/25/2007  . OSTEOARTHRITIS OF SPINE, NOS 02/25/2007    Past Surgical History:  Procedure Laterality Date  . NECK SURGERY          Home Medications    Prior to Admission medications   Medication Sig Start Date End Date Taking? Authorizing Provider   cyclobenzaprine (FLEXERIL) 10 MG tablet Take 10 mg by mouth 2 (two) times daily as needed.      [provider]  diclofenac sodium (VOLTAREN) 1 % GEL Apply 4 g topically 4 (four) times daily. 06/28/19   ,  C, PA-C  doxycycline (VIBRAMYCIN) 100 MG capsule Take 1 tab twice a day for 10 days then 1 a day for a month     [provider]  gabapentin (NEURONTIN) 300 MG capsule Take 1 capsule (300 mg total) by mouth 3 (three) times daily. 06/28/19 08/27/19  , Hillard Danker C, PA-C  hexachlorophene (PHISOHEX) 3 % liquid use daily for 3 days each month - 1 bottle     [provider]  HYDROcodone-acetaminophen (NORCO/VICODIN) 5-325 MG per tablet Take 1-2 tablets by mouth every 4 (four) hours as needed for moderate pain or severe pain. 12/26/14   Trixie DredgeWest, Emily, PA-C  metoprolol (TOPROL-XL) 50 MG 24 hr tablet Take 50 mg by mouth daily.      [provider]  mupirocin (BACTROBAN) 2 % cream Apply topically 3 (three) times daily. to skin lesions     [provider]  sertraline (ZOLOFT) 100 MG tablet Take 100 mg by mouth daily.      [provider]    Family History History reviewed. No pertinent family history.  Social History Social History   Tobacco Use  . Smoking status: Current Every Day Smoker    Types: Cigarettes  .  Smokeless tobacco: Never Used  Substance Use Topics  . Alcohol use: No  . Drug use: No     Allergies   Patient has no known allergies.   Review of Systems Review of Systems  Constitutional: Negative for fever.  Respiratory: Negative for shortness of breath.   Cardiovascular: Negative for chest pain.  Gastrointestinal: Negative for abdominal pain, nausea and vomiting.  Genitourinary: Negative for difficulty urinating.  Musculoskeletal: Positive for back pain and neck pain.  Neurological: Negative for dizziness, weakness, light-headedness, numbness and headaches.  All other systems reviewed and are negative.    Physical  Exam Updated Vital Signs BP (!) 162/97 (BP Location: Left Arm)   Pulse 90   Temp 98.8 F (37.1 C) (Oral)   Resp 14   Ht 5\' 9"  (1.753 m)   Wt 74.4 kg   SpO2 100%   BMI 24.22 kg/m   Physical Exam Vitals signs and nursing note reviewed.  Constitutional:      General: He is not in acute distress.    Appearance: He is well-developed. He is not diaphoretic.  HENT:     Head: Normocephalic and atraumatic.     Mouth/Throat:     Mouth: Mucous membranes are moist.     Pharynx: Oropharynx is clear.  Eyes:     Conjunctiva/sclera: Conjunctivae normal.  Neck:     Musculoskeletal: Normal range of motion and neck supple.  Cardiovascular:     Rate and Rhythm: Normal rate and regular rhythm.     Pulses: Normal pulses.          Radial pulses are 2+ on the right side and 2+ on the left side.       Posterior tibial pulses are 2+ on the right side and 2+ on the left side.     Comments: Tactile temperature in the extremities appropriate and equal bilaterally. Pulmonary:     Effort: Pulmonary effort is normal. No respiratory distress.  Abdominal:     Tenderness: There is no guarding.  Musculoskeletal:     Right lower leg: No edema.     Left lower leg: No edema.     Comments: Tenderness to the midline cervical and lumbar spines without swelling, deformity, step-off, or noted instability.  No noted crepitus.  Lymphadenopathy:     Cervical: No cervical adenopathy.  Skin:    General: Skin is warm and dry.  Neurological:     Mental Status: He is alert and oriented to person, place, and time.     Comments: Sensation grossly intact to light touch through each of the nerve distributions of the bilateral upper extremities.  He does indicate some paresthesias in the left C6 distribution. Abduction and adduction of the fingers intact against resistance. Grip strength equal bilaterally. Supination and pronation intact against resistance. Strength 5/5 through the cardinal directions of the bilateral  wrists. Strength 5/5 with flexion and extension of the bilateral elbows. Patient can touch the thumb to each one of the fingertips without difficulty.   Sensation grossly intact to light touch in the lower extremities bilaterally. No saddle anesthesias. Strength 5/5 in the bilateral lower extremities. No noted gait deficit. Coordination intact with heel to shin testing.  Cranial nerves III through XII grossly intact.  No facial droop.  Psychiatric:        Mood and Affect: Mood and affect normal.        Speech: Speech normal.        Behavior: Behavior normal.  ED Treatments / Results  Labs (all labs ordered are listed, but only abnormal results are displayed) Labs Reviewed - No data to display  EKG None  Radiology No results found.  Procedures Procedures (including critical care time)  Medications Ordered in ED Medications - No data to display   Initial Impression / Assessment and Plan / ED Course  I have reviewed the triage vital signs and the nursing notes.  Pertinent labs & imaging results that were available during my care of the patient were reviewed by me and considered in my medical decision making (see chart for details).        Patient presents with chronic neck and back pain.  He came to the ED today because he states, "I am just ready to not be in pain anymore.  I have been in pain for so long and now I am ready to do something about it." No focal neuro deficits that would indicate a need for emergent MRI or emergent neurosurgical consult.  Shared decision-making was used regarding plain film imaging.  Patient ultimately decided to defer imaging.  Neurosurgery follow-up. The patient was given instructions for home care as well as return precautions. Patient voices understanding of these instructions, accepts the plan, and is comfortable with discharge.    Final Clinical Impressions(s) / ED Diagnoses   Final diagnoses:  Chronic midline low back pain  without sciatica  Chronic neck pain    ED Discharge Orders         Ordered    gabapentin (NEURONTIN) 300 MG capsule  3 times daily     06/28/19 1603    diclofenac sodium (VOLTAREN) 1 % GEL  4 times daily     06/28/19 1608           Lorayne Bender, PA-C 06/28/19 1616    Hayden Rasmussen, MD 06/29/19 [phone]

## 2019-07-05 ENCOUNTER — Other Ambulatory Visit: Payer: Self-pay | Admitting: Student

## 2019-07-05 DIAGNOSIS — M5416 Radiculopathy, lumbar region: Secondary | ICD-10-CM

## 2019-07-06 ENCOUNTER — Telehealth: Payer: Self-pay

## 2019-07-06 NOTE — Telephone Encounter (Signed)
Spoke with patient to review his meds and allergies prior to being schedule for a myelogram.  I see an order for only the lumbar spine, though he states the order should include the cervical spine, too.  He states he will call Deidre Ala at Dr. Windy Carina office to clear this up.  He has no medications to stop, and he was informed he will be here about two hours for the procedure, will need a driver and will need to be on strict bedrest for 24 hours after the procedure.

## 2019-07-07 ENCOUNTER — Other Ambulatory Visit: Payer: Self-pay | Admitting: Student

## 2019-07-07 DIAGNOSIS — M5416 Radiculopathy, lumbar region: Secondary | ICD-10-CM

## 2019-07-11 ENCOUNTER — Other Ambulatory Visit: Payer: Self-pay

## 2019-07-11 ENCOUNTER — Ambulatory Visit
Admission: RE | Admit: 2019-07-11 | Discharge: 2019-07-11 | Disposition: A | Discharge status: Home / Self Care | Payer: Self-pay | Source: Ambulatory Visit | Attending: Student | Admitting: Student

## 2019-07-11 DIAGNOSIS — M5416 Radiculopathy, lumbar region: Secondary | ICD-10-CM

## 2019-07-11 MED ORDER — MEPERIDINE HCL 50 MG/ML IJ SOLN
50.0000 mg | Freq: Once | INTRAMUSCULAR | Status: AC
  Administered 2019-07-11: 11:00:00 50 mg via INTRAMUSCULAR

## 2019-07-11 MED ORDER — DIAZEPAM 5 MG PO TABS
10.0000 mg | ORAL_TABLET | Freq: Once | ORAL | Status: AC
  Administered 2019-07-11: 5 mg via ORAL

## 2019-07-11 MED ORDER — IOPAMIDOL (ISOVUE-M 300) INJECTION 61%
10.0000 mL | Freq: Once | INTRAMUSCULAR | Status: AC | PRN
  Administered 2019-07-11: 11:00:00 10 mL via INTRATHECAL

## 2019-07-11 MED ORDER — ONDANSETRON HCL 4 MG/2ML IJ SOLN
4.0000 mg | Freq: Once | INTRAMUSCULAR | Status: AC
  Administered 2019-07-11: 11:00:00 4 mg via INTRAMUSCULAR

## 2019-07-11 NOTE — Discharge Instructions (Signed)

## 2020-10-02 ENCOUNTER — Telehealth: Payer: Self-pay

## 2020-10-02 ENCOUNTER — Ambulatory Visit (HOSPITAL_COMMUNITY)
Admission: RE | Admit: 2020-10-02 | Discharge: 2020-10-02 | Disposition: A | Discharge status: Home / Self Care | Payer: Medicaid Other | Source: Ambulatory Visit | Attending: Family Medicine | Admitting: Family Medicine

## 2020-10-02 ENCOUNTER — Other Ambulatory Visit: Payer: Self-pay

## 2020-10-02 ENCOUNTER — Encounter: Payer: Self-pay | Admitting: Family Medicine

## 2020-10-02 ENCOUNTER — Ambulatory Visit (INDEPENDENT_AMBULATORY_CARE_PROVIDER_SITE_OTHER): Payer: Medicaid Other | Admitting: Family Medicine

## 2020-10-02 VITALS — BP 120/60 | HR 80 | Ht 69.0 in | Wt 165.4 lb

## 2020-10-02 DIAGNOSIS — Z114 Encounter for screening for human immunodeficiency virus [HIV]: Secondary | ICD-10-CM

## 2020-10-02 DIAGNOSIS — Z23 Encounter for immunization: Secondary | ICD-10-CM | POA: Diagnosis not present

## 2020-10-02 DIAGNOSIS — Z5320 Procedure and treatment not carried out because of patient's decision for unspecified reasons: Secondary | ICD-10-CM

## 2020-10-02 DIAGNOSIS — R002 Palpitations: Secondary | ICD-10-CM

## 2020-10-02 DIAGNOSIS — Z122 Encounter for screening for malignant neoplasm of respiratory organs: Secondary | ICD-10-CM | POA: Diagnosis not present

## 2020-10-02 DIAGNOSIS — Z1211 Encounter for screening for malignant neoplasm of colon: Secondary | ICD-10-CM

## 2020-10-02 HISTORY — DX: Palpitations: R00.2

## 2020-10-02 NOTE — Telephone Encounter (Signed)
Spoke with pt. Informed him of his CT at [facility] location  on Oct 20th at 11:30. Pt understood. Aquilla Solian, CMA

## 2020-10-02 NOTE — Assessment & Plan Note (Addendum)
Normal physical exam today.  EKG showed no evidence of heart block or enlarged atria.  We will follow-up with CBC and BMP in addition to referring him to cardiology for a Holter monitor.  I am concerned about transient A. fib or SVT want to have a thorough work-up done. -Follow-up CBC, BMP -Referral to cardiology for Holter monitor

## 2020-10-02 NOTE — Patient Instructions (Addendum)
I am glad you came in today.  There are number of things that we can work on getting done today.  Palpitations: I do think that we need to work-up his heart palpitations that you brought up.  For now, we will get blood work and an EKG.  I will contact you about setting up a longer term event monitor.  Lung cancer screening: We will try to get an appointment scheduled for you before you leave clinic today for a low-dose lung CT.  Colon cancer screening: You should get a call in the next 1-2 weeks to set up an appointment for colonoscopy.  I note that there were a couple of things that we did not get to discuss today like smoking and your knee pain.  Please come back in the next several weeks whenever is convenient for further discussion.  For your blood work, if everything is normal, I will send you a message on my chart.  If there is anything discussed, I will give you a call.

## 2020-10-02 NOTE — Progress Notes (Signed)
    SUBJECTIVE:   CHIEF COMPLAINT / HPI:   Mr. Rick [name] was seen in clinic today primarily to establish care.  The majority of his medical and surgical history is filled out through the history tab.  In addition to his medical and surgical history, we also discussed the following issues:  Palpitations Rick [name] reports that he was hoping to discuss heart palpitations.  He reports infrequent episodes of heart fluttering or racing for roughly the past 4 years.  These seem to occur about 2 times per month.  During his episodes, he does note feeling lightheaded and finds the sensation disconcerting.  He has no known heart history and wants to know if anything more needs to be done to work-up this issue.  Nicotine use Rick [name] currently smokes 1 pack/day.  He has been smoking for roughly the past 35 years.  He is interested in lung cancer screening.  PERTINENT  PMH / PSH: Current smoker, multiple neck surgeries  OBJECTIVE:   BP 120/60   Pulse 80   Ht 5\' 9"  (1.753 m)   Wt 165 lb 6 oz (75 kg)   SpO2 99%   BMI 24.42 kg/m   General: Alert and cooperative and appears to be in no acute distress HEENT: Neck non-tender without lymphadenopathy, masses or thyromegaly Cardio: Normal S1 and S2, no S3 or S4. Rhythm is regular. No murmurs or rubs.   Pulm: Clear to auscultation bilaterally, no crackles, wheezing, or diminished breath sounds. Normal respiratory effort Abdomen: Bowel sounds normal. Abdomen soft and non-tender.  Extremities: No peripheral edema. Warm/ well perfused.  Strong radial pulse. Neuro: Cranial nerves grossly intact  ASSESSMENT/PLAN:   Palpitations Normal physical exam today.  EKG showed no evidence of heart block or enlarged atria.  We will follow-up with CBC and BMP in addition to referring him to cardiology for a Holter monitor. -Follow-up CBC, BMP -Referral to cardiology for Holter monitor   Health maintenance -Referral to GI for colonoscopy -Low-dose CT chest placed  for lung cancer screening  Please follow-up in clinic in the next 2-4 weeks for further discussion about the issues we were not able to cover today.  , MD [facility]

## 2020-10-03 LAB — HIV ANTIBODY (ROUTINE TESTING W REFLEX): HIV Screen 4th Generation wRfx: NONREACTIVE

## 2020-10-03 LAB — BASIC METABOLIC PANEL
BUN/Creatinine Ratio: 8 — ABNORMAL LOW (ref 9–20)
BUN: 9 mg/dL (ref 6–24)
CO2: 24 mmol/L (ref 20–29)
Calcium: 9.9 mg/dL (ref 8.7–10.2)
Chloride: 100 mmol/L (ref 96–106)
Creatinine, Ser: 1.13 mg/dL (ref 0.76–1.27)
GFR calc Af Amer: 87 mL/min/{1.73_m2} (ref >59)
GFR calc non Af Amer: 75 mL/min/{1.73_m2} (ref >59)
Glucose: 87 mg/dL (ref 65–99)
Potassium: 4.5 mmol/L (ref 3.5–5.2)
Sodium: 138 mmol/L (ref 134–144)

## 2020-10-03 LAB — CBC
Hematocrit: 46.7 % (ref 37.5–51.0)
Hemoglobin: 15.9 g/dL (ref 13.0–17.7)
MCH: 29.8 pg (ref 26.6–33.0)
MCHC: 34 g/dL (ref 31.5–35.7)
MCV: 88 fL (ref 79–97)
Platelets: 317 10*3/uL (ref 150–450)
RBC: 5.34 x10E6/uL (ref 4.14–5.80)
RDW: 13.1 % (ref 11.6–15.4)
WBC: 7.7 10*3/uL (ref 3.4–10.8)

## 2020-10-03 LAB — HEPATITIS C ANTIBODY: Hep C Virus Ab: 0.2 s/co ratio (ref 0.0–0.9)

## 2020-10-17 ENCOUNTER — Other Ambulatory Visit: Payer: Medicaid Other

## 2020-10-26 ENCOUNTER — Telehealth: Payer: Self-pay | Admitting: Radiology

## 2020-10-26 ENCOUNTER — Encounter: Payer: Self-pay | Admitting: Internal Medicine

## 2020-10-26 ENCOUNTER — Other Ambulatory Visit: Payer: Self-pay

## 2020-10-26 ENCOUNTER — Ambulatory Visit (INDEPENDENT_AMBULATORY_CARE_PROVIDER_SITE_OTHER): Payer: Medicaid Other | Admitting: Internal Medicine

## 2020-10-26 VITALS — BP 112/70 | HR 70 | Ht 69.0 in | Wt 163.0 lb

## 2020-10-26 DIAGNOSIS — R002 Palpitations: Secondary | ICD-10-CM

## 2020-10-26 DIAGNOSIS — Z720 Tobacco use: Secondary | ICD-10-CM

## 2020-10-26 NOTE — Patient Instructions (Signed)
Medication Instructions:  No changes *If you need a refill on your cardiac medications before your next appointment, please call your pharmacy*   Lab Work: none If you have labs (blood work) drawn today and your tests are completely normal, you will receive your results only by: Marland Kitchen MyChart Message (if you have MyChart) OR . A paper copy in the mail If you have any lab test that is abnormal or we need to change your treatment, we will call you to review the results.   Testing/Procedures: Christena Deem- Long Term Monitor Instructions   Your physician has requested you wear your ZIO patch monitor_______days.   This is a single patch monitor.  [employer] supplies one patch monitor per enrollment.  Additional stickers are not available.   Please do not apply patch if you will be having a Nuclear Stress Test, Echocardiogram, Cardiac CT, MRI, or Chest Xray during the time frame you would be wearing the monitor. The patch cannot be worn during these tests.  You cannot remove and re-apply the ZIO XT patch monitor.   Your ZIO patch monitor will be sent USPS Priority mail from [facility] directly to your home address. The monitor may also be mailed to a PO BOX if home delivery is not available.   It may take 3-5 days to receive your monitor after you have been enrolled.   Once you have received you monitor, please review enclosed instructions.  Your monitor has already been registered assigning a specific monitor serial # to you.   Applying the monitor   Shave hair from upper left chest.   Hold abrader disc by orange tab.  Rub abrader in 40 strokes over left upper chest as indicated in your monitor instructions.   Clean area with 4 enclosed alcohol pads .  Use all pads to assure are is cleaned thoroughly.  Let dry.   Apply patch as indicated in monitor instructions.  Patch will be place under collarbone on left side of chest with arrow pointing upward.   Rub patch adhesive wings for 2  minutes.Remove white label marked "1".  Remove white label marked "2".  Rub patch adhesive wings for 2 additional minutes.   While looking in a mirror, press and release button in center of patch.  A small green light will flash 3-4 times .  This will be your only indicator the monitor has been turned on.     Do not shower for the first 24 hours.  You may shower after the first 24 hours.   Press button if you feel a symptom. You will hear a small click.  Record Date, Time and Symptom in the Patient Log Book.   When you are ready to remove patch, follow instructions on last 2 pages of Patient Log Book.  Stick patch monitor onto last page of Patient Log Book.   Place Patient Log Book in [place] box.  Use locking tab on box and tape box closed securely.  The [employer] has [employer] on it.  Please place in mailbox as soon as possible.  Your physician should have your test results approximately 7 days after the monitor has been mailed back to [facility].   Call [facility] Customer Care at [phone] if you have questions regarding your ZIO XT patch monitor.  Call them immediately if you see an orange light blinking on your monitor.   If your monitor falls off in less than 4 days contact our Monitor department at  [phone].  If your monitor becomes loose or falls off after 4 days call [employer] at [phone] for suggestions on securing your monitor.     Follow-Up: At [facility], you and your health needs are our priority.  As part of our continuing mission to provide you with exceptional heart care, we have created designated Provider Care Teams.  These Care Teams include your primary Cardiologist (physician) and Advanced Practice Providers (APPs -  Physician Assistants and Nurse Practitioners) who all work together to provide you with the care you need, when you need it.  Your next appointment:   3 month(s)  The format for your next appointment:   In  Person  Provider:   Riley Lam, MD   Other Instructions

## 2020-10-26 NOTE — Progress Notes (Signed)
Cardiology Office Note:    Date:  10/26/2020   ID:  Rick [name], DOB [birth year], MRN [id number]  PCP:  Mirian Mo, MD  [facility] Cardiologist:  Christell Constant, MD  [facility] Electrophysiologist:  None   CC: Palpitations Consulted for the evaluation of palpitations at the behest of Mirian Mo, MD  History of Present Illness:    Rick [name] is a 49 y.o. male with a hx of depression, tobacco abuse who presents with palpitations.  Patient notes that once a week and he had palpitations.  This kept him from the [employer] in 1987.  Patient notes that he never passed out.  Every time he has this issue his heart rapidly speeds up for 5-20 seconds to 3-4 minutes with rapid termination.  Can be sitting and spontaneously occur.  Has lead to ED and EMS evals in the past.  Has occurred all his life.   No alcohol, OSA, and walks his dogs 3 miles a day. No syncope or near syncope.  Can abort these events with slow controlled breaths.  Past Medical History:  Diagnosis Date  . Back pain   . Depression   . Neck pain     Past Surgical History:  Procedure Laterality Date  . NECK SURGERY      Current Medications: Current Meds  Medication Sig  . gabapentin (NEURONTIN) 300 MG capsule Take 1 capsule (300 mg total) by mouth 3 (three) times daily.     Allergies:   Patient has no known allergies.   Social History   Socioeconomic History  . Marital status: Significant Other    Spouse name: Not on file  . Number of children: Not on file  . Years of education: Not on file  . Highest education level: Not on file  Occupational History  . Occupation: Disability  Tobacco Use  . Smoking status: Current Every Day Smoker    Packs/day: 1.00    Years: 35.00    Pack years: 35.00    Types: Cigarettes  . Smokeless tobacco: Never Used  Substance and Sexual Activity  . Alcohol use: No  . Drug use: No  . Sexual activity: Yes  Other Topics Concern  . Not on file  Social  History Narrative  . Not on file   Social Determinants of Health   Financial Resource Strain:   . Difficulty of Paying Living Expenses: Not on file  Food Insecurity:   . Worried About Programme researcher, broadcasting/film/video in the Last Year: Not on file  . Ran Out of Food in the Last Year: Not on file  Transportation Needs:   . Lack of Transportation (Medical): Not on file  . Lack of Transportation (Non-Medical): Not on file  Physical Activity:   . Days of Exercise per Week: Not on file  . Minutes of Exercise per Session: Not on file  Stress:   . Feeling of Stress : Not on file  Social Connections:   . Frequency of Communication with Friends and Family: Not on file  . Frequency of Social Gatherings with Friends and Family: Not on file  . Attends Religious Services: Not on file  . Active Member of Clubs or Organizations: Not on file  . Attends Banker Meetings: Not on file  . Marital Status: Not on file     Family History: The patient's family history includes Cancer in his father, maternal grandfather, and mother.  No arrhythmia issues.   Grandfather had MI No SCD,  drowning's, or car accidents.  ROS:   Please see the history of present illness.    All other systems reviewed and are negative.  EKGs/Labs/Other Studies Reviewed:    The following studies were reviewed today:  EKG:   10/02/20 SR rate 70 no ST/T changes  Recent Labs: 10/02/2020: BUN 9; Creatinine, Ser 1.13; Hemoglobin 15.9; Platelets 317; Potassium 4.5; Sodium 138   Physical Exam:    VS:  BP 112/70   Pulse 70   Ht 5\' 9"  (1.753 m)   Wt 163 lb (73.9 kg)   SpO2 98%   BMI 24.07 kg/m     Wt Readings from Last 3 Encounters:  10/26/20 163 lb (73.9 kg)  10/02/20 165 lb 6 oz (75 kg)  06/28/19 164 lb (74.4 kg)     GEN: Well nourished, well developed in no acute distress HEENT: Normal NECK: No JVD; No carotid bruits LYMPHATICS: No lymphadenopathy CARDIAC: RRR, no murmurs, rubs, gallops RESPIRATORY:  Clear to  auscultation without rales, wheezing or rhonchi  ABDOMEN: Soft, non-tender, non-distended MUSCULOSKELETAL:  No edema; No deformity  SKIN: Warm and dry NEUROLOGIC:  Alert and oriented x 3 PSYCHIATRIC:  Normal affect   ASSESSMENT:    1. Palpitations   2. Tobacco abuse    PLAN:    In order of problems listed above:  Palpitations - described as SVT that occurs at least once a week - 14 day non live ziopatch  - low threshold to start metoprolol tartrate 12.5 mg BID for suppression  Tobacco Abuse 1. The patient was counseled on the dangers of tobacco use, both inhaled and oral, which include, but are not limited to cardiovascular disease, increased cancer risk of multiple types of cancer, COPD, peripheral vascular disease, strokes. 2. He was also counseled on the benefits of smoking cessation. 3. The patient was firmly advised to quit.    4. We also reviewed strategies to maximize success, including:  Removing cigarettes and smoking materials from environment  Stress management  Substitution of other forms of reinforcement Support of family/friends.  Selecting a quit date.  Patient provided contact information for 1-800-QUIT-NOW    3 month  follow up unless new symptoms or abnormal test results warranting change in plan  Would be reasonable for Virtual Follow up  Would be reasonable for APP Follow up   Medication Adjustments/Labs and Tests Ordered: Current medicines are reviewed at length with the patient today.  Concerns regarding medicines are outlined above.  No orders of the defined types were placed in this encounter.  No orders of the defined types were placed in this encounter.   There are no Patient Instructions on file for this visit.   Signed, 06/30/19, MD  10/26/2020 12:05 PM     [facility]

## 2020-10-26 NOTE — Telephone Encounter (Signed)
Enrolled patient for a 14 day Zio XT  monitor to be mailed to patients home  °

## 2020-10-31 ENCOUNTER — Other Ambulatory Visit (INDEPENDENT_AMBULATORY_CARE_PROVIDER_SITE_OTHER): Payer: Medicaid Other

## 2020-10-31 DIAGNOSIS — R002 Palpitations: Secondary | ICD-10-CM | POA: Diagnosis not present

## 2020-11-09 ENCOUNTER — Encounter: Payer: Self-pay | Admitting: Gastroenterology

## 2021-01-09 ENCOUNTER — Encounter: Payer: Medicaid Other | Admitting: Gastroenterology

## 2021-01-24 ENCOUNTER — Encounter: Payer: Self-pay | Admitting: Family Medicine

## 2021-01-24 ENCOUNTER — Other Ambulatory Visit: Payer: Self-pay

## 2021-01-24 ENCOUNTER — Ambulatory Visit: Payer: Medicaid Other | Admitting: Family Medicine

## 2021-01-24 VITALS — BP 118/80 | HR 84 | Wt 158.0 lb

## 2021-01-24 DIAGNOSIS — Z720 Tobacco use: Secondary | ICD-10-CM

## 2021-01-24 DIAGNOSIS — M549 Dorsalgia, unspecified: Secondary | ICD-10-CM

## 2021-01-24 DIAGNOSIS — M545 Low back pain, unspecified: Secondary | ICD-10-CM

## 2021-01-24 MED ORDER — BACLOFEN 10 MG PO TABS: 10.0000 mg | ORAL_TABLET | Freq: Every day | ORAL | 0 refills | Status: DC

## 2021-01-24 NOTE — Progress Notes (Signed)
    SUBJECTIVE:   CHIEF COMPLAINT / HPI:   Back pain Rick [name] has a history of chronic low back pain that seems to have acutely worsened in the past week.  He notes that he was moving a lot of heavy objects from his truck this past week and has had a significantly worsened low back pain on the right.  This pain is described as a severe achy dullness in his right lower back with some shooting pain up his back with certain positions.  He specifically denies any penile or anal numbness.  He denies fecal and urinary incontinence.  He currently sees a pain medicine specialist who prescribed some oxycodone but he is not able to find where he placed his medication.  He is not currently taking Tylenol or Motrin.  He does take gabapentin 300 mg 3 times daily.  He would like to know if any other medication is appropriate for him to take.  Health maintenance He knows that he needs to reschedule his colonoscopy and CT chest for lung cancer screening.  He has been busy lately and has the appropriate numbers he needs to call.  Smoking cessation He notes that he has been making an effort to decrease his smoking and has cut back about 40% compared to her last visit.  He is interested in Chantix if that is still a possibility.  PERTINENT  PMH / PSH: Tobacco use  OBJECTIVE:   BP 118/80   Pulse 84   Wt 158 lb (71.7 kg)   SpO2 97%   BMI 23.33 kg/m    General: Alert and cooperative and appears to be in no acute distress Back: No tenderness with percussion of the lumbar vertebrae.  No significant tenderness with paraspinal muscles.  He did have some tenderness to palpation of the right SI joint.  Negative straight leg raise bilaterally.  No pain with FABER or FADIR testing. Extremities: No peripheral edema. Warm/ well perfused.  Strong radial pulses. Neuro: Cranial nerves grossly intact  ASSESSMENT/PLAN:   Back pain Sounds like a likely muscle strain in the setting of his known chronic low back pain.   He was encouraged to add ibuprofen and Tylenol to his current pain regimen of oxycodone 5 mg.  He was also given baclofen to use at night.  He was told that he simply needed time and rest for his back to fully heal. -Tylenol and ibuprofen as directed -Baclofen nightly -Follow-up lumbar spine x-ray -Follow-up in 4 weeks  Tobacco abuse Sadly, Chantix is not available right now so cannot be prescribed.  He was encouraged to keep up the good work with his smoking reduction and we can talk more about this at his next appointment in 1 month.  He was also encouraged to follow-up with scheduling his CT chest.     Mirian Mo, MD [facility]

## 2021-01-24 NOTE — Patient Instructions (Signed)
It was great to see you today.  Here is a quick review of the things we talked about:  Back pain: I expect this is likely a pulled muscle in your back that simply needs time to heal.  For now, think it is appropriate for you to take up to 4000 mg of Tylenol daily in addition to ibuprofen.  For ibuprofen, you can take 600 mg every 6 hours.  I have also prescribed you a muscle relaxer for you to use at night which may help you sleep.  I am sorry this is been so difficult for you.  I do think this will improve with time.  Lets follow-up in 1 month to make sure you are getting better.  Colonoscopy: I will just keep bugging you about this.  In a have a longer plate right now.  Make sure this gets rescheduled when you have time.  Lung cancer screening: Similarly, I know you have a lot on her plate right now.  Lets make sure this gets rescheduled once you have more time.  Smoking cessation: Lets talk more about this when I see you in 1 month.

## 2021-01-24 NOTE — Assessment & Plan Note (Signed)
Sounds like a likely muscle strain in the setting of his known chronic low back pain.  He was encouraged to add ibuprofen and Tylenol to his current pain regimen of oxycodone 5 mg.  He was also given baclofen to use at night.  He was told that he simply needed time and rest for his back to fully heal. -Tylenol and ibuprofen as directed -Baclofen nightly -Follow-up lumbar spine x-ray -Follow-up in 4 weeks

## 2021-01-24 NOTE — Assessment & Plan Note (Signed)
Sadly, Chantix is not available right now so cannot be prescribed.  He was encouraged to keep up the good work with his smoking reduction and we can talk more about this at his next appointment in 1 month.  He was also encouraged to follow-up with scheduling his CT chest.

## 2021-01-28 ENCOUNTER — Other Ambulatory Visit: Payer: Self-pay

## 2021-01-28 ENCOUNTER — Encounter: Payer: Self-pay | Admitting: Internal Medicine

## 2021-01-28 ENCOUNTER — Ambulatory Visit (INDEPENDENT_AMBULATORY_CARE_PROVIDER_SITE_OTHER): Payer: Medicaid Other | Admitting: Internal Medicine

## 2021-01-28 VITALS — BP 110/60 | HR 76 | Ht 69.0 in | Wt 161.0 lb

## 2021-01-28 DIAGNOSIS — I491 Atrial premature depolarization: Secondary | ICD-10-CM

## 2021-01-28 DIAGNOSIS — R002 Palpitations: Secondary | ICD-10-CM

## 2021-01-28 DIAGNOSIS — Z720 Tobacco use: Secondary | ICD-10-CM

## 2021-01-28 HISTORY — DX: Atrial premature depolarization: I49.1

## 2021-01-28 MED ORDER — METOPROLOL TARTRATE 25 MG PO TABS: 12.5000 mg | ORAL_TABLET | Freq: Two times a day (BID) | ORAL | 3 refills | Status: DC

## 2021-01-28 NOTE — Progress Notes (Signed)
.  Cardiology Office Note:    Date:  01/28/2021   ID:  Rick [name], DOB 12-31-1970, MRN [id number]  PCP:  Mirian Mo, MD  [facility] Cardiologist:  Christell Constant, MD  [facility] Electrophysiologist:  None   CC: follow up palpitations  History of Present Illness:    Rick [name] is a 50 y.o. male with a hx of depression, Former tobacco abuse (stopped since primary visit with occasional relaplse)  hx of likely SVT that has abated with vagal maneuvers in the past who presented for evaluation 10/26/20.  In interval, had ZioPatch notable for occasional symptomatic PACs.  Patient notes that he is doing great.  Since last visit notes notes that he has had a couple of funny heart beats accompanied but transient SOB.  Two weeks ago was sitting at a restaurant eating and felt 30 seconds of palpitations.  There are no interval hospital/ED visit.    No chest pain or pressure .  No SOB/DOE and no PND/Orthopnea.  No weight gain or leg swelling.  No syncope.  These are different palpitations feel different than what he had previously on the monitor and feels   A pack of cigarettes may last him a month or longer.  No depression or SI.   Past Medical History:  Diagnosis Date  . Back pain   . Depression   . Neck pain     Past Surgical History:  Procedure Laterality Date  . NECK SURGERY      Current Medications: Current Meds  Medication Sig  . baclofen (LIORESAL) 10 MG tablet Take 1 tablet (10 mg total) by mouth at bedtime.  . gabapentin (NEURONTIN) 300 MG capsule Take 300 mg by mouth 3 (three) times daily.  . metoprolol tartrate (LOPRESSOR) 25 MG tablet Take 0.5 tablets (12.5 mg total) by mouth 2 (two) times daily.  Marland Kitchen oxyCODONE (OXY IR/ROXICODONE) 5 MG immediate release tablet Take 5 mg by mouth 3 (three) times daily as needed for moderate pain or severe pain.  . [DISCONTINUED] gabapentin (NEURONTIN) 300 MG capsule Take 1 capsule (300 mg total) by mouth 3 (three) times  daily.     Allergies:   Patient has no known allergies.   Social History   Socioeconomic History  . Marital status: Significant Other    Spouse name: Not on file  . Number of children: Not on file  . Years of education: Not on file  . Highest education level: Not on file  Occupational History  . Occupation: Disability  Tobacco Use  . Smoking status: Current Every Day Smoker    Packs/day: 1.00    Years: 35.00    Pack years: 35.00    Types: Cigarettes  . Smokeless tobacco: Never Used  Substance and Sexual Activity  . Alcohol use: No  . Drug use: No  . Sexual activity: Yes  Other Topics Concern  . Not on file  Social History Narrative  . Not on file   Social Determinants of Health   Financial Resource Strain: Not on file  Food Insecurity: Not on file  Transportation Needs: Not on file  Physical Activity: Not on file  Stress: Not on file  Social Connections: Not on file     Family History: The patient's family history includes Cancer in his father, maternal grandfather, and mother.  History of coronary artery disease notable for grandfather. History of heart failure notable for no members. History of arrhythmia notable for no members. Denies family history of  sudden cardiac death including drowning, car accidents, or unexplained deaths in the family.  ROS:   Please see the history of present illness.    All other systems reviewed and are negative.  EKGs/Labs/Other Studies Reviewed:    The following studies were reviewed today:  EKG:   10/02/20 SR rate 70 no ST/T changes  Cardiac Event Monitoring: Date: 11/27/2020 Results:  Patient had a minimum heart rate of 49 bpm, maximum heart rate of 165 bpm, and average heart rate of 77 bpm.  Predominant underlying rhythm was sinus rhythm.  Isolated PACs were rare (<1.0%), with rare couplets.  Isolated PVCs were rare (<1.0%).  No evidence of complete heart block.  Triggered and diary events associated with  sinus rhythm and PACs.   No malignant arrhythmias. Rare PACs that, occasionally, are symptomatic.   Recent Labs: 10/02/2020: BUN 9; Creatinine, Ser 1.13; Hemoglobin 15.9; Platelets 317; Potassium 4.5; Sodium 138   Physical Exam:    VS:  BP 110/60   Pulse 76   Ht 5\' 9"  (1.753 m)   Wt 161 lb (73 kg)   SpO2 98%   BMI 23.78 kg/m     Wt Readings from Last 3 Encounters:  01/28/21 161 lb (73 kg)  01/24/21 158 lb (71.7 kg)  10/26/20 163 lb (73.9 kg)     GEN: Well nourished, well developed in no acute distress HEENT: Normal NECK: No JVD; No carotid bruits LYMPHATICS: No lymphadenopathy CARDIAC: RRR, no murmurs, rubs, gallops RESPIRATORY:  Clear to auscultation without rales, wheezing or rhonchi  ABDOMEN: Soft, non-tender, non-distended MUSCULOSKELETAL:  No edema; No deformity  SKIN: Warm and dry NEUROLOGIC:  Alert and oriented x 3 PSYCHIATRIC:  Normal affect   ASSESSMENT:    1. PAC (premature atrial contraction)   2. Tobacco abuse    PLAN:    In order of problems listed above:  Palpitations; possible SVT PACs - AV Nodal Therapy: will trial metoprolol 12.5 mg PO BID; discussed risks and benefits of this medication for fatigue and improvement of SVT  Tobacco Abuse- Occasional Relapses noted - discussed the dangers of tobacco use, both inhaled and oral, which include, but are not limited to cardiovascular disease, increased cancer risk of multiple types of cancer, COPD, peripheral arterial disease, strokes. - counseled on the benefits of smoking cessation. - firmly advised to quit.  - we also reviewed strategies to maximize success, including:  Removing cigarettes and smoking materials from environment   Stress management  Substitution of other forms of reinforcement (the one cigarette a day approach)  Support of family/friends and group smoking cessation  Selecting a quit date  Patient provided contact information for [facility] or 1-800-QUIT-NOW  Patient  provided with [place]'s 8 free smoking cessation classes: (33610/31/21 and ) [phone]   SHARED DECISION MAKING: Patient has had no history of seizure, no suicidal ideation and no antidepressant medications.  Discussed risk, including seizure threshold/insomnia/dry mouth/headache, and benefits and will trial a 12 week course:  150 mg/day for three days, then 150 mg twice daily.  Patient notes that he will defer at this point but will call if he is interested  Time Spent Directly with Patient:   I have spent a total of 30 miuntes with the patient reviewing  notes, EKGs, studies and examining the patient as well as establishing an assessment and plan that was discussed personally with the patient (smoking cessation and medications; PACS and SVT).  > 50% of time was spent in direct patient care.  Three months follow up unless new symptoms or abnormal test results warranting change in plan  Would be reasonable for  Video Visit Follow up  Would be reasonable for  APP Follow up   Medication Adjustments/Labs and Tests Ordered: Current medicines are reviewed at length with the patient today.  Concerns regarding medicines are outlined above.  No orders of the defined types were placed in this encounter.  Meds ordered this encounter  Medications  . metoprolol tartrate (LOPRESSOR) 25 MG tablet    Sig: Take 0.5 tablets (12.5 mg total) by mouth 2 (two) times daily.    Dispense:  90 tablet    Refill:  3    Patient Instructions  Medication Instructions:  1) START Metoprolol Tartrate 12.5mg  twice daily  *If you need a refill on your cardiac medications before your next appointment, please call your pharmacy*   Lab Work: None If you have labs (blood work) drawn today and your tests are completely normal, you will receive your results only by: Marland Kitchen MyChart Message (if you have MyChart) OR . A paper copy in the mail If you have any lab test that is abnormal or we need to  change your treatment, we will call you to review the results.   Testing/Procedures: None   Follow-Up: At [facility], you and your health needs are our priority.  As part of our continuing mission to provide you with exceptional heart care, we have created designated Provider Care Teams.  These Care Teams include your primary Cardiologist (physician) and Advanced Practice Providers (APPs -  Physician Assistants and Nurse Practitioners) who all work together to provide you with the care you need, when you need it.  We recommend signing up for the patient portal called "MyChart".  Sign up information is provided on this After Visit Summary.  MyChart is used to connect with patients for Virtual Visits (Telemedicine).  Patients are able to view lab/test results, encounter notes, upcoming appointments, etc.  Non-urgent messages can be sent to your provider as well.   To learn more about what you can do with MyChart, go to [email or web address].    Your next appointment:   3 month(s)  The format for your next appointment:   In Person  Provider:   You may see Christell Constant, MD or one of the following Advanced Practice Providers on your designated Care Team:    Ronie Spies, PA-C  Jacolyn Reedy, PA-C    Other Instructions      Signed, Christell Constant, MD  01/28/2021 12:01 PM    [facility]

## 2021-01-28 NOTE — Patient Instructions (Signed)
Medication Instructions:  1) START Metoprolol Tartrate 12.5mg  twice daily  *If you need a refill on your cardiac medications before your next appointment, please call your pharmacy*   Lab Work: None If you have labs (blood work) drawn today and your tests are completely normal, you will receive your results only by: Marland Kitchen MyChart Message (if you have MyChart) OR . A paper copy in the mail If you have any lab test that is abnormal or we need to change your treatment, we will call you to review the results.   Testing/Procedures: None   Follow-Up: At [facility], you and your health needs are our priority.  As part of our continuing mission to provide you with exceptional heart care, we have created designated Provider Care Teams.  These Care Teams include your primary Cardiologist (physician) and Advanced Practice Providers (APPs -  Physician Assistants and Nurse Practitioners) who all work together to provide you with the care you need, when you need it.  We recommend signing up for the patient portal called "MyChart".  Sign up information is provided on this After Visit Summary.  MyChart is used to connect with patients for Virtual Visits (Telemedicine).  Patients are able to view lab/test results, encounter notes, upcoming appointments, etc.  Non-urgent messages can be sent to your provider as well.   To learn more about what you can do with MyChart, go to [email or web address].    Your next appointment:   3 month(s)  The format for your next appointment:   In Person  Provider:   You may see Christell Constant, MD or one of the following Advanced Practice Providers on your designated Care Team:    Ronie Spies, PA-C  Jacolyn Reedy, PA-C    Other Instructions

## 2021-04-18 ENCOUNTER — Ambulatory Visit: Payer: Medicaid Other | Admitting: Internal Medicine

## 2021-06-23 NOTE — Progress Notes (Signed)
.  Cardiology Office Note:    Date:  06/24/2021   ID:  Rick [name], DOB 01/27/1971, MRN [id number]  PCP:  Mirian Mo, MD  [facility] Cardiologist:  Christell Constant, MD  [facility] Electrophysiologist:  None   CC: Follow up palpitations on BB; smoking cessation  History of Present Illness:    Rick [name] is a 50 y.o. male with a hx of depression, tobacco abuse (stopped  with occasional relaplse)  hx of likely SVT that has abated with vagal maneuvers in the past who presented for evaluation 10/26/20.  In interval, had ZioPatch notable for occasional symptomatic PACs. Last seen 01/28/21.  In interim of this visit, patient was started on low dose metoprolol.  Patient notes that he is doing OK off the metoprolol.  Since last visit notes that with the metoprolol had diarrhea and felt sluggish and weak.  This resolved off of metoprolol.  No chest pain or pressure.  No SOB/DOE and no PND/Orthopnea.  No weight gain or leg swelling.  One episodes with palpitations since last visit that resolved after 30 seconds.   Past Medical History:  Diagnosis Date   Back pain    BOILS, RECURRENT 04/13/2007   Qualifier: Diagnosis of  By: Deirdre Priest MD, [place]     Depression    GASTROESOPHAGEAL REFLUX, NO ESOPHAGITIS 02/25/2007   Qualifier: Diagnosis of  By: Haydee Salter     MIGRAINE, UNSPEC., W/O INTRACTABLE MIGRAINE 02/25/2007   Qualifier: Diagnosis of  By: Haydee Salter     Neck pain    OSTEOARTHRITIS OF SPINE, NOS 02/25/2007   Qualifier: Diagnosis of  By: Haydee Salter     PAC (premature atrial contraction) 01/28/2021   Palpitations 10/02/2020    Past Surgical History:  Procedure Laterality Date   NECK SURGERY      Current Medications: Current Meds  Medication Sig   gabapentin (NEURONTIN) 300 MG capsule Take 300 mg by mouth 4 (four) times daily.   Oxycodone HCl 10 MG TABS Take 10 mg by mouth daily as needed.   [DISCONTINUED] baclofen (LIORESAL) 10 MG tablet Take 1  tablet (10 mg total) by mouth at bedtime.   [DISCONTINUED] metoprolol tartrate (LOPRESSOR) 25 MG tablet Take 0.5 tablets (12.5 mg total) by mouth 2 (two) times daily.   [DISCONTINUED] oxyCODONE (OXY IR/ROXICODONE) 5 MG immediate release tablet Take 5 mg by mouth 3 (three) times daily as needed for moderate pain or severe pain.     Allergies:   Patient has no known allergies.   Social History   Socioeconomic History   Marital status: Significant Other    Spouse name: Not on file   Number of children: Not on file   Years of education: Not on file   Highest education level: Not on file  Occupational History   Occupation: Disability  Tobacco Use   Smoking status: Every Day    Packs/day: 1.00    Years: 35.00    Pack years: 35.00    Types: Cigarettes   Smokeless tobacco: Never  Substance and Sexual Activity   Alcohol use: No   Drug use: No   Sexual activity: Yes  Other Topics Concern   Not on file  Social History Narrative   Not on file   Social Determinants of Health   Financial Resource Strain: Not on file  Food Insecurity: Not on file  Transportation Needs: Not on file  Physical Activity: Not on file  Stress: Not on file  Social Connections: Not  on file     Family History: The patient's family history includes Cancer in his father, maternal grandfather, and mother.  History of coronary artery disease notable for grandfather. History of heart failure notable for no members. History of arrhythmia notable for no members. Denies family history of sudden cardiac death including drowning, car accidents, or unexplained deaths in the family.  ROS:   Please see the history of present illness.    All other systems reviewed and are negative.  EKGs/Labs/Other Studies Reviewed:    The following studies were reviewed today:  EKG:   10/02/20 SR rate 70 no ST/T changes  Cardiac Event Monitoring: Date: 11/27/2020 Results: Patient had a minimum heart rate of 49 bpm, maximum  heart rate of 165 bpm, and average heart rate of 77 bpm. Predominant underlying rhythm was sinus rhythm. Isolated PACs were rare (<1.0%), with rare couplets. Isolated PVCs were rare (<1.0%). No evidence of complete heart block. Triggered and diary events associated with sinus rhythm and PACs.   No malignant arrhythmias. Rare PACs that, occasionally, are symptomatic.   Recent Labs: 10/02/2020: BUN 9; Creatinine, Ser 1.13; Hemoglobin 15.9; Platelets 317; Potassium 4.5; Sodium 138   Physical Exam:    VS:  BP 140/78   Pulse 84   Ht 5\' 10"  (1.778 m)   Wt 68.9 kg   SpO2 98%   BMI 21.81 kg/m     Wt Readings from Last 3 Encounters:  06/24/21 68.9 kg  01/28/21 73 kg  01/24/21 71.7 kg     GEN: Well nourished, well developed in no acute distress; smells of smoke HEENT: Normal NECK: No JVD LYMPHATICS: No lymphadenopathy CARDIAC: RRR, no murmurs, rubs, gallops RESPIRATORY:  Clear to auscultation without rales, wheezing or rhonchi  ABDOMEN: Soft, non-tender, non-distended MUSCULOSKELETAL:  No edema; No deformity  SKIN: Warm and dry NEUROLOGIC:  Alert and oriented x 3 PSYCHIATRIC:  Normal affect   ASSESSMENT:    1. Paroxysmal SVT (supraventricular tachycardia) (HCC)   2. PAC (premature atrial contraction)   3. Tobacco abuse     PLAN:    In order of problems listed above:  Paroxysmal SVT and PACs Pre-operative assessment - AV Nodal Therapy: Will change metoprolol 12.5 mg PO as only PRN - presently would be low risk for back surgery with RCRI 0, fMETS greater than 6  Tobacco Abuse- reading to try medication intervention - discussed the dangers of tobacco use, both inhaled and oral, which include, but are not limited to cardiovascular disease, increased cancer risk of multiple types of cancer, COPD, peripheral arterial disease, strokes. - counseled on the benefits of smoking cessation. - firmly advised to quit.  - we also reviewed strategies to maximize success,  including: Removing cigarettes and smoking materials from environment  Stress management Substitution of other forms of reinforcement (the one cigarette a day approach) Support of family/friends and group smoking cessation Selecting a quit date Patient provided contact information for [facility] or 1-800-QUIT-NOW Patient provided with [place] 8 free smoking cessation classes: (336) ([phone] and [phone]  - Patient has no disordered sleep, insomnia, abnormal dreams, no suicidal ideation on no antidepressant medications, CrCL gerater than 30 and would like to try varenicline.  Discussed risks, including nausea and sleep issues, and benefits and will trial a 12 week course: 0.5 mg once daily for three days, then 0.5 mg twice daily for four days, and then 1 mg twice daily for the remainder of a 12-week course; reviewed adverse events with patient  One year follow up unless new symptoms or abnormal test results warranting change in plan  Would be reasonable for  APP Follow up    Medication Adjustments/Labs and Tests Ordered: Current medicines are reviewed at length with the patient today.  Concerns regarding medicines are outlined above.  No orders of the defined types were placed in this encounter.  No orders of the defined types were placed in this encounter.   There are no Patient Instructions on file for this visit.   Signed, Christell Constant, MD  06/24/2021 10:41 AM    [facility]

## 2021-06-24 ENCOUNTER — Encounter: Payer: Self-pay | Admitting: Internal Medicine

## 2021-06-24 ENCOUNTER — Ambulatory Visit (INDEPENDENT_AMBULATORY_CARE_PROVIDER_SITE_OTHER): Payer: Medicaid Other | Admitting: Internal Medicine

## 2021-06-24 ENCOUNTER — Other Ambulatory Visit: Payer: Self-pay

## 2021-06-24 VITALS — BP 140/78 | HR 84 | Ht 70.0 in | Wt 152.0 lb

## 2021-06-24 DIAGNOSIS — I491 Atrial premature depolarization: Secondary | ICD-10-CM

## 2021-06-24 DIAGNOSIS — Z720 Tobacco use: Secondary | ICD-10-CM

## 2021-06-24 DIAGNOSIS — I471 Supraventricular tachycardia: Secondary | ICD-10-CM

## 2021-06-24 MED ORDER — VARENICLINE TARTRATE 0.5 MG PO TABS: ORAL_TABLET | ORAL | 0 refills | Status: DC

## 2021-06-24 MED ORDER — METOPROLOL TARTRATE 25 MG PO TABS: 12.5000 mg | ORAL_TABLET | Freq: Two times a day (BID) | ORAL | 3 refills | Status: DC | PRN

## 2021-06-24 NOTE — Patient Instructions (Signed)
Medication Instructions:  Your physician has recommended you make the following change in your medication:  Take metoprolol 12.5 mg by mouth twice daily as needed START: Chantix    Take 0.5 mg by mouth daily for 3 days  Then take 0.5 mg by mouth twice daily for 4 days  Then take 1 mg by mouth twice daily for 11 weeks  *If you need a refill on your cardiac medications before your next appointment, please call your pharmacy*   Lab Work: NONE If you have labs (blood work) drawn today and your tests are completely normal, you will receive your results only by: MyChart Message (if you have MyChart) OR A paper copy in the mail If you have any lab test that is abnormal or we need to change your treatment, we will call you to review the results.   Testing/Procedures: NONE   Follow-Up: At [facility], you and your health needs are our priority.  As part of our continuing mission to provide you with exceptional heart care, we have created designated Provider Care Teams.  These Care Teams include your primary Cardiologist (physician) and Advanced Practice Providers (APPs -  Physician Assistants and Nurse Practitioners) who all work together to provide you with the care you need, when you need it.  We recommend signing up for the patient portal called "MyChart".  Sign up information is provided on this After Visit Summary.  MyChart is used to connect with patients for Virtual Visits (Telemedicine).  Patients are able to view lab/test results, encounter notes, upcoming appointments, etc.  Non-urgent messages can be sent to your provider as well.   To learn more about what you can do with MyChart, go to [email or web address].    Your next appointment:   12 month(s)  The format for your next appointment:   In Person  Provider:   You may see Christell Constant, MD or one of the following Advanced Practice Providers on your designated Care Team:   Ronie Spies, PA-C Jacolyn Reedy,  PA-C

## 2021-07-23 ENCOUNTER — Other Ambulatory Visit: Payer: Self-pay | Admitting: Neurosurgery

## 2021-07-23 DIAGNOSIS — M5416 Radiculopathy, lumbar region: Secondary | ICD-10-CM

## 2021-08-05 ENCOUNTER — Other Ambulatory Visit: Payer: Self-pay

## 2021-08-05 ENCOUNTER — Ambulatory Visit
Admission: RE | Admit: 2021-08-05 | Discharge: 2021-08-05 | Disposition: A | Discharge status: Home / Self Care | Payer: Medicaid Other | Source: Ambulatory Visit | Attending: Neurosurgery | Admitting: Neurosurgery

## 2021-08-05 DIAGNOSIS — M5416 Radiculopathy, lumbar region: Secondary | ICD-10-CM

## 2021-09-14 ENCOUNTER — Other Ambulatory Visit: Payer: Self-pay | Admitting: Internal Medicine

## 2021-09-16 ENCOUNTER — Other Ambulatory Visit: Payer: Self-pay | Admitting: *Deleted

## 2021-09-20 NOTE — Telephone Encounter (Signed)
Called pt regarding refill request for Chantix.  Pt stated he didn't ask for a refill, as when he started it, it made him feel awful, zapped all of his energy. Pt is still smoking but asked if there was something else he can try?  Will send back to Dr. Mayford Knife for any advice!

## 2021-09-26 NOTE — Telephone Encounter (Signed)
Patient has not tried Wellbutrin.

## 2021-09-27 MED ORDER — BUPROPION HCL ER (SR) 150 MG PO TB12: 150.0000 mg | ORAL_TABLET | Freq: Two times a day (BID) | ORAL | 0 refills | Status: DC

## 2021-09-27 NOTE — Addendum Note (Signed)
Addended by: Theresia Majors on: 09/27/2021 09:58 AM   Modules accepted: Orders

## 2021-09-27 NOTE — Telephone Encounter (Signed)
Rick Reichert, MD  You 7 minutes ago (9:47 AM)   Try Wellbutrin 150mg  daily for 3 days then increase to BID for 3 months.  Evening  should be taken before 6pm nightly.  Needs to wean off cigarettes in the first week    Spoke with the patient to discuss new prescription being sent in for him. Patient verbalized understanding.

## 2021-09-27 NOTE — Addendum Note (Signed)
Addended by: Nelia Rogoff on: 09/27/2021 09:58 AM   Modules accepted: Orders  

## 2021-10-03 NOTE — Progress Notes (Signed)
    SUBJECTIVE:   CHIEF COMPLAINT / HPI:   "Left ear blockage": 51 year old male presenting for the above.  He states he has had difficulty hearing out of his left ear in particular for the past few days.  He has a history of cerumen impaction.  States that he feels he can hear out of his right ear okay at this time.  He has tried to do earwax and removal with Q-tip but has been unsuccessful.  PERTINENT  PMH / PSH: None relevant OBJECTIVE:   BP 126/80   Pulse 80   Ht 5\' 10"  (1.778 m)   Wt 157 lb (71.2 kg)   SpO2 99%   BMI 22.53 kg/m    General: NAD, pleasant, able to participate in exam HEENT: Impacted cerumen present in both the right and the left auditory canal.  On repeat exam after irrigation both tympanic membranes are visible and are grossly normal, there is no longer cerumen impacted in the auditory canal. Respiratory: No respiratory distress Neuro: alert, no obvious focal deficits Psych: Normal affect and mood  ASSESSMENT/PLAN:      Impacted cerumen: 50 year old male with difficulty hearing out of his left ear with impacted cerumen present in both left and right auditory canal on physical exam.  Irrigation was attempted with success.  On repeat exam tympanic membrane's are visible and are normal with no cerumen blocking the canal.     42, DO [facility]

## 2021-10-04 ENCOUNTER — Ambulatory Visit (INDEPENDENT_AMBULATORY_CARE_PROVIDER_SITE_OTHER): Payer: Medicaid Other | Admitting: Family Medicine

## 2021-10-04 ENCOUNTER — Encounter: Payer: Self-pay | Admitting: Family Medicine

## 2021-10-04 ENCOUNTER — Other Ambulatory Visit: Payer: Self-pay

## 2021-10-04 VITALS — BP 126/80 | HR 80 | Ht 70.0 in | Wt 157.0 lb

## 2021-10-04 DIAGNOSIS — H6123 Impacted cerumen, bilateral: Secondary | ICD-10-CM | POA: Diagnosis not present

## 2021-10-04 NOTE — Patient Instructions (Addendum)
You have earwax blocking both ear canals.  We attempted irrigation today which was successful.  If you develop any other concerns or still feeling that your hearing is off please send a MyChart message and I can get you referred to the audiology/hearing specialist, otherwise you should notice a significant improvement in your symptoms.

## 2021-10-08 ENCOUNTER — Encounter: Payer: Self-pay | Admitting: Family Medicine

## 2021-10-09 ENCOUNTER — Other Ambulatory Visit: Payer: Self-pay | Admitting: Family Medicine

## 2021-10-09 DIAGNOSIS — H9192 Unspecified hearing loss, left ear: Secondary | ICD-10-CM

## 2021-12-09 ENCOUNTER — Other Ambulatory Visit: Payer: Self-pay | Admitting: Neurosurgery

## 2021-12-24 NOTE — Pre-Procedure Instructions (Signed)
Surgical Instructions    Your procedure is scheduled on Friday 01/03/22.   Report to [facility] Main Entrance "A" at 12:35 P.M., then check in with the Admitting office.  Call this number if you have problems the morning of surgery:  [phone]   If you have any questions prior to your surgery date call [phone]: Open Monday-Friday 8am-4pm    Remember:  Do not eat or drink after midnight the night before your surgery     Take these medicines the morning of surgery with A SIP OF WATER   gabapentin (NEURONTIN)  Oxycodone- If needed  As of today, STOP taking any Aspirin (unless otherwise instructed by your surgeon) Aleve, Naproxen, Ibuprofen, Motrin, Advil, Goody's, BC's, all herbal medications, fish oil, and all vitamins.     After your COVID test   You are not required to quarantine however you are required to wear a well-fitting mask when you are out and around people not in your household.  If your mask becomes wet or soiled, replace with a new one.  Wash your hands often with soap and water for 20 seconds or clean your hands with an alcohol-based hand sanitizer that contains at least 60% alcohol.  Do not share personal items.  Notify your provider: if you are in close contact with someone who has COVID  or if you develop a fever of 100.4 or greater, sneezing, cough, sore throat, shortness of breath or body aches.             Do not wear jewelry or makeup Do not wear lotions, powders, perfumes/colognes, or deodorant. Do not shave 48 hours prior to surgery.  Men may shave face and neck. Do not bring valuables to the hospital. DO Not wear nail polish, gel polish, artificial nails, or any other type of covering on natural nails including finger and toenails. If patients have artificial nails, gel coating, etc. that need to be removed by a nail salon, please have this removed prior to surgery or surgery may need to be canceled/delayed if the surgeon/ anesthesia feels  like the patient is unable to be adequately monitored.             [facility] is not responsible for any belongings or valuables.  Do NOT Smoke (Tobacco/Vaping)  24 hours prior to your procedure  If you use a CPAP at night, you may bring your mask for your overnight stay.   Contacts, glasses, hearing aids, dentures or partials may not be worn into surgery, please bring cases for these belongings   For patients admitted to the hospital, discharge time will be determined by your treatment team.   Patients discharged the day of surgery will not be allowed to drive home, and someone needs to stay with them for 24 hours.  NO VISITORS WILL BE ALLOWED IN PRE-OP WHERE PATIENTS ARE PREPPED FOR SURGERY.  ONLY 1 SUPPORT PERSON MAY BE PRESENT IN THE WAITING ROOM WHILE YOU ARE IN SURGERY.  IF YOU ARE TO BE ADMITTED, ONCE YOU ARE IN YOUR ROOM YOU WILL BE ALLOWED TWO (2) VISITORS. 1 (ONE) VISITOR MAY STAY OVERNIGHT BUT MUST ARRIVE TO THE ROOM BY 8pm.  Minor children may have two parents present. Special consideration for safety and communication needs will be reviewed on a case by case basis.  Special instructions:    Oral Hygiene is also important to reduce your risk of infection.  Remember - BRUSH YOUR TEETH THE MORNING OF SURGERY WITH YOUR REGULAR TOOTHPASTE  [facility]- Preparing For Surgery  Before surgery, you can play an important role. Because skin is not sterile, your skin needs to be as free of germs as possible. You can reduce the number of germs on your skin by washing with CHG (chlorahexidine gluconate) Soap before surgery.  CHG is an antiseptic cleaner which kills germs and bonds with the skin to continue killing germs even after washing.     Please do not use if you have an allergy to CHG or antibacterial soaps. If your skin becomes reddened/irritated stop using the CHG.  Do not shave (including legs and underarms) for at least 48 hours prior to first CHG shower. It is OK to shave your  face.  Please follow these instructions carefully.     Shower the NIGHT BEFORE SURGERY and the MORNING OF SURGERY with CHG Soap.   If you chose to wash your hair, wash your hair first as usual with your normal shampoo. After you shampoo, rinse your hair and body thoroughly to remove the shampoo.  Then [employer] and genitals (private parts) with your normal soap and rinse thoroughly to remove soap.  After that Use CHG Soap as you would any other liquid soap. You can apply CHG directly to the skin and wash gently with a scrungie or a clean washcloth.   Apply the CHG Soap to your body ONLY FROM THE NECK DOWN.  Do not use on open wounds or open sores. Avoid contact with your eyes, ears, mouth and genitals (private parts). Wash Face and genitals (private parts)  with your normal soap.   Wash thoroughly, paying special attention to the area where your surgery will be performed.  Thoroughly rinse your body with warm water from the neck down.  DO NOT shower/wash with your normal soap after using and rinsing off the CHG Soap.  Pat yourself dry with a CLEAN TOWEL.  Wear CLEAN PAJAMAS to bed the night before surgery  Place CLEAN SHEETS on your bed the night before your surgery  DO NOT SLEEP WITH PETS.   Day of Surgery:  Take a shower with CHG soap. Wear Clean/Comfortable clothing the morning of surgery Do not apply any deodorants/lotions.   Remember to brush your teeth WITH YOUR REGULAR TOOTHPASTE.   Please read over the following fact sheets that you were given.

## 2021-12-25 ENCOUNTER — Encounter (HOSPITAL_COMMUNITY)
Admission: RE | Admit: 2021-12-25 | Discharge: 2021-12-25 | Disposition: A | Discharge status: Home / Self Care | Payer: Medicaid Other | Source: Ambulatory Visit | Attending: Neurosurgery | Admitting: Neurosurgery

## 2021-12-25 ENCOUNTER — Other Ambulatory Visit: Payer: Self-pay

## 2021-12-25 ENCOUNTER — Encounter (HOSPITAL_COMMUNITY): Payer: Self-pay

## 2021-12-25 VITALS — BP 131/85 | HR 78 | Temp 97.8°F | Resp 18 | Ht 70.0 in | Wt 152.0 lb

## 2021-12-25 DIAGNOSIS — R002 Palpitations: Secondary | ICD-10-CM | POA: Diagnosis not present

## 2021-12-25 DIAGNOSIS — Z01818 Encounter for other preprocedural examination: Secondary | ICD-10-CM | POA: Diagnosis present

## 2021-12-25 DIAGNOSIS — I471 Supraventricular tachycardia: Secondary | ICD-10-CM | POA: Insufficient documentation

## 2021-12-25 DIAGNOSIS — Z87891 Personal history of nicotine dependence: Secondary | ICD-10-CM | POA: Insufficient documentation

## 2021-12-25 DIAGNOSIS — M5126 Other intervertebral disc displacement, lumbar region: Secondary | ICD-10-CM | POA: Diagnosis not present

## 2021-12-25 DIAGNOSIS — K219 Gastro-esophageal reflux disease without esophagitis: Secondary | ICD-10-CM | POA: Diagnosis not present

## 2021-12-25 LAB — SURGICAL PCR SCREEN
MRSA, PCR: NEGATIVE
Staphylococcus aureus: NEGATIVE

## 2021-12-25 LAB — BASIC METABOLIC PANEL
Anion gap: 6 (ref 5–15)
BUN: 9 mg/dL (ref 6–20)
CO2: 27 mmol/L (ref 22–32)
Calcium: 9.4 mg/dL (ref 8.9–10.3)
Chloride: 104 mmol/L (ref 98–111)
Creatinine, Ser: 1.05 mg/dL (ref 0.61–1.24)
GFR, Estimated: >60 mL/min (ref >60)
Glucose, Bld: 115 mg/dL — ABNORMAL HIGH (ref 70–99)
Potassium: 3.7 mmol/L (ref 3.5–5.1)
Sodium: 137 mmol/L (ref 135–145)

## 2021-12-25 LAB — CBC
HCT: 45.9 % (ref 39.0–52.0)
Hemoglobin: 15.4 g/dL (ref 13.0–17.0)
MCH: 30.8 pg (ref 26.0–34.0)
MCHC: 33.6 g/dL (ref 30.0–36.0)
MCV: 91.8 fL (ref 80.0–100.0)
Platelets: 299 10*3/uL (ref 150–400)
RBC: 5 MIL/uL (ref 4.22–5.81)
RDW: 12.2 % (ref 11.5–15.5)
WBC: 7.2 10*3/uL (ref 4.0–10.5)
nRBC: 0 % (ref 0.0–0.2)

## 2021-12-25 NOTE — Progress Notes (Signed)
PCP - Jackelyn Poling Cardiologist - Dr. Izora Ribas  Chest x-ray - Not indicated EKG - 12/25/21  COVID TEST- 12/31/20 @ 9am   Anesthesia review: yes cardiac history  Patient denies shortness of breath, fever, cough and chest pain at PAT appointment   All instructions explained to the patient, with a verbal understanding of the material. Patient agrees to go over the instructions while at home for a better understanding. Patient also instructed to wear a mask whil in public after being tested for COVID-19. The opportunity to ask questions was provided.

## 2021-12-26 ENCOUNTER — Other Ambulatory Visit: Payer: Self-pay | Admitting: Cardiology

## 2021-12-26 NOTE — Progress Notes (Signed)
Anesthesia Chart Review:  Case: [id number] Date/Time: 01/03/22 1422   Procedure: Microdiscectomy - L2-L3 - right (Right: Back) - 3C   Anesthesia type: General   Pre-op diagnosis: HNP   Location: [facility] OR ROOM 21 / [place] OR   Surgeons: Kary Kos, MD       DISCUSSION: Patient is a 50 year old male scheduled for the above procedure.  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  Evaluated by cardiologist Dr. Dimas Chyle initially on 10/26/10 for long history of palpitations. Heart rate could speed up for seconds to few minutes with rapid termination. Paroxysmal SVT suspected with resolution after vagal-type maneuvers. No syncope or near syncope. No chest pain. No SOB/DOE. No alcohol. Able to walk his dogs 3 miles a day. 10/2020 Ziop[facility] showed no malignant arrhythmias, rare PACs that were occasionally symptomatic. Low dose metoprolol was trialed, but discontinued due to side effects of sluggishness, weakness and diarrhea. Metoprolol changed to as needed only. In regards to preoperative assessment, he wrote, "AV Nodal Therapy: Will change metoprolol 12.5 mg PO as only PRN - presently would be low risk for back surgery with RCRI 0, fMETS greater than 6".    Anesthesia team to evaluate on the day of surgery.    VS: BP 131/85    Pulse 78    Temp 36.6 C (Oral)    Resp 18    Ht 5\' 10"  (1.778 m)    Wt 68.9 kg    SpO2 100%    BMI 21.81 kg/m    PROVIDERS: Lurline Del, DO is PCP [facility] Rudean Haskell, MD is cardiologist. Last visit 06/24/21. One year follow-up planned unless new symptoms warrant sooner visit.    LABS: Labs reviewed: Acceptable for surgery. (all labs ordered are listed, but only abnormal results are displayed)  Labs Reviewed  BASIC METABOLIC PANEL - Abnormal; Notable for the following components:      Result Value   Glucose, Bld 115 (*)    All other  components within normal limits  SURGICAL PCR SCREEN  CBC     IMAGES: MRI L-spine 08/05/21: IMPRESSION: 1. 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 2. Mild spinal canal stenosis at L3-4. 3. Moderate right and mild left neural foraminal narrowing at L4-5. 4. Moderate left neural foraminal narrowing at L5-S1.    EKG: 12/25/21: SB at 53 bpm   CV: ZioXT long term monitor 10/31/20-11/14/20: Patient had a minimum heart rate of 49 bpm, maximum heart rate of 165 bpm, and average heart rate of 77 bpm. Predominant underlying rhythm was sinus rhythm. Isolated PACs were rare (<1.0%), with rare couplets. Isolated PVCs were rare (<1.0%). No evidence of complete heart block. Triggered and diary events associated with sinus rhythm and PACs.  No malignant arrhythmias. Rare PACs that, occasionally, are symptomatic.  Past Medical History:  Diagnosis Date   Back pain    BOILS, RECURRENT 04/13/2007   Qualifier: Diagnosis of  By: Erin Hearing MD, [place]     Depression    GASTROESOPHAGEAL REFLUX, NO ESOPHAGITIS 02/25/2007   Qualifier: Diagnosis of  By: Drucie Ip     MIGRAINE, UNSPEC., W/O INTRACTABLE MIGRAINE 02/25/2007   Qualifier: Diagnosis of  By: Drucie Ip     Neck pain    OSTEOARTHRITIS OF SPINE, NOS 02/25/2007   Qualifier: Diagnosis of  By: Drucie Ip  PAC (premature atrial contraction) 01/28/2021   Palpitations 10/02/2020    Past Surgical History:  Procedure Laterality Date   NECK SURGERY     x 3   TONSILLECTOMY Bilateral 1976    MEDICATIONS:  buPROPion (WELLBUTRIN SR) 150 MG 12 hr tablet   gabapentin (NEURONTIN) 600 MG tablet   metoprolol tartrate (LOPRESSOR) 25 MG tablet   Oxycodone HCl 10 MG TABS   No current facility-administered medications for this encounter.    Shonna Chock, PA-C Surgical Short  Stay/Anesthesiology [facility] Phone [phone] [facility] Phone [phone] 12/26/2021 5:22 PM

## 2021-12-26 NOTE — Anesthesia Preprocedure Evaluation (Addendum)
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  Reproductive/Obstetrics                         Anesthesia Physical Anesthesia Plan  ASA: 2  Anesthesia Plan: General   Post-op Pain Management:    Induction: Intravenous  PONV Risk Score and Plan: 2 and Midazolam, Dexamethasone and Ondansetron  Airway Management Planned: Oral ETT and Video Laryngoscope Planned  Additional Equipment:   Intra-op Plan:   Post-operative Plan: Extubation in OR  Informed Consent: I have reviewed the patients History and Physical, chart, labs and discussed the procedure including the risks, benefits and alternatives for the proposed anesthesia with the patient or authorized representative who has indicated his/her understanding and acceptance.     Dental advisory given  Plan Discussed with: CRNA  Anesthesia Plan Comments: (PAT note written 12/26/2021 by Shonna Chock, PA-C. )       Anesthesia Quick Evaluation

## 2021-12-29 HISTORY — PX: OTHER SURGICAL HISTORY: SHX169

## 2021-12-31 ENCOUNTER — Other Ambulatory Visit: Payer: Self-pay | Admitting: Neurosurgery

## 2021-12-31 ENCOUNTER — Other Ambulatory Visit (HOSPITAL_COMMUNITY)
Admission: RE | Admit: 2021-12-31 | Discharge: 2021-12-31 | Disposition: A | Discharge status: Home / Self Care | Payer: Medicaid Other | Source: Ambulatory Visit | Attending: Neurosurgery | Admitting: Neurosurgery

## 2021-12-31 DIAGNOSIS — Z01818 Encounter for other preprocedural examination: Secondary | ICD-10-CM

## 2021-12-31 DIAGNOSIS — Z01812 Encounter for preprocedural laboratory examination: Secondary | ICD-10-CM | POA: Diagnosis not present

## 2021-12-31 DIAGNOSIS — Z20822 Contact with and (suspected) exposure to covid-19: Secondary | ICD-10-CM | POA: Insufficient documentation

## 2021-12-31 LAB — SARS CORONAVIRUS 2 (TAT 6-24 HRS): SARS Coronavirus 2: NEGATIVE

## 2022-01-03 ENCOUNTER — Ambulatory Visit (HOSPITAL_COMMUNITY): Payer: Medicaid Other

## 2022-01-03 ENCOUNTER — Ambulatory Visit (HOSPITAL_COMMUNITY)
Admission: RE | Admit: 2022-01-03 | Discharge: 2022-01-04 | Disposition: A | Discharge status: Home / Self Care | Payer: Medicaid Other | Attending: Neurosurgery | Admitting: Neurosurgery

## 2022-01-03 ENCOUNTER — Encounter (HOSPITAL_COMMUNITY)
Admission: RE | Disposition: A | Discharge status: Home / Self Care | Payer: Self-pay | Source: Home / Self Care | Attending: Neurosurgery

## 2022-01-03 ENCOUNTER — Ambulatory Visit (HOSPITAL_COMMUNITY): Payer: Medicaid Other | Admitting: Anesthesiology

## 2022-01-03 ENCOUNTER — Ambulatory Visit (HOSPITAL_COMMUNITY): Payer: Medicaid Other | Admitting: Vascular Surgery

## 2022-01-03 DIAGNOSIS — M5116 Intervertebral disc disorders with radiculopathy, lumbar region: Secondary | ICD-10-CM | POA: Insufficient documentation

## 2022-01-03 DIAGNOSIS — F17210 Nicotine dependence, cigarettes, uncomplicated: Secondary | ICD-10-CM | POA: Diagnosis not present

## 2022-01-03 DIAGNOSIS — M5126 Other intervertebral disc displacement, lumbar region: Secondary | ICD-10-CM | POA: Diagnosis present

## 2022-01-03 DIAGNOSIS — Z419 Encounter for procedure for purposes other than remedying health state, unspecified: Secondary | ICD-10-CM

## 2022-01-03 HISTORY — PX: LUMBAR LAMINECTOMY/DECOMPRESSION MICRODISCECTOMY: SHX5026

## 2022-01-03 SURGERY — LUMBAR LAMINECTOMY/DECOMPRESSION MICRODISCECTOMY 1 LEVEL
Anesthesia: General | Site: Back | Laterality: Right

## 2022-01-03 MED ORDER — ACETAMINOPHEN 500 MG PO TABS
ORAL_TABLET | ORAL | Status: AC
  Administered 2022-01-03: 1000 mg via ORAL
  Filled 2022-01-03: qty 2

## 2022-01-03 MED ORDER — SODIUM CHLORIDE 0.9% FLUSH: 3.0000 mL | INTRAVENOUS | Status: DC | PRN

## 2022-01-03 MED ORDER — MIDAZOLAM HCL 2 MG/2ML IJ SOLN
INTRAMUSCULAR | Status: AC
  Filled 2022-01-03: qty 2

## 2022-01-03 MED ORDER — 0.9 % SODIUM CHLORIDE (POUR BTL) OPTIME
TOPICAL | Status: DC | PRN
  Administered 2022-01-03: 1000 mL

## 2022-01-03 MED ORDER — ROCURONIUM BROMIDE 10 MG/ML (PF) SYRINGE
PREFILLED_SYRINGE | INTRAVENOUS | Status: DC | PRN
  Administered 2022-01-03: 60 mg via INTRAVENOUS

## 2022-01-03 MED ORDER — CEFAZOLIN SODIUM-DEXTROSE 2-4 GM/100ML-% IV SOLN: 2.0000 g | Freq: Three times a day (TID) | INTRAVENOUS | Status: DC

## 2022-01-03 MED ORDER — FENTANYL CITRATE (PF) 100 MCG/2ML IJ SOLN
INTRAMUSCULAR | Status: AC
  Filled 2022-01-03: qty 2

## 2022-01-03 MED ORDER — SODIUM CHLORIDE 0.9 % IV SOLN
250.0000 mL | INTRAVENOUS | Status: DC
  Administered 2022-01-03: 250 mL via INTRAVENOUS

## 2022-01-03 MED ORDER — THROMBIN 5000 UNITS EX SOLR
CUTANEOUS | Status: AC
  Filled 2022-01-03: qty 10000

## 2022-01-03 MED ORDER — THROMBIN 5000 UNITS EX SOLR
CUTANEOUS | Status: DC | PRN
  Administered 2022-01-03 (×2): 5000 [IU] via TOPICAL

## 2022-01-03 MED ORDER — PHENOL 1.4 % MT LIQD: 1.0000 | OROMUCOSAL | Status: DC | PRN

## 2022-01-03 MED ORDER — HYDROMORPHONE HCL 1 MG/ML IJ SOLN: 1.0000 mg | INTRAMUSCULAR | Status: DC | PRN

## 2022-01-03 MED ORDER — MENTHOL 3 MG MT LOZG: 1.0000 | LOZENGE | OROMUCOSAL | Status: DC | PRN

## 2022-01-03 MED ORDER — CHLORHEXIDINE GLUCONATE CLOTH 2 % EX PADS: 6.0000 | MEDICATED_PAD | Freq: Once | CUTANEOUS | Status: DC

## 2022-01-03 MED ORDER — FENTANYL CITRATE (PF) 250 MCG/5ML IJ SOLN
INTRAMUSCULAR | Status: DC | PRN
  Administered 2022-01-03: 100 ug via INTRAVENOUS
  Administered 2022-01-03 (×3): 50 ug via INTRAVENOUS

## 2022-01-03 MED ORDER — HYDROMORPHONE HCL 1 MG/ML IJ SOLN
0.5000 mg | INTRAMUSCULAR | Status: DC | PRN
  Administered 2022-01-03: 0.5 mg via INTRAVENOUS
  Filled 2022-01-03: qty 0.5

## 2022-01-03 MED ORDER — OXYCODONE HCL 5 MG PO TABS
10.0000 mg | ORAL_TABLET | ORAL | Status: DC | PRN
  Administered 2022-01-03 – 2022-01-04 (×5): 10 mg via ORAL
  Filled 2022-01-03 (×5): qty 2

## 2022-01-03 MED ORDER — CEFAZOLIN SODIUM-DEXTROSE 2-4 GM/100ML-% IV SOLN
2.0000 g | INTRAVENOUS | Status: AC
  Administered 2022-01-03: 2 g via INTRAVENOUS

## 2022-01-03 MED ORDER — LACTATED RINGERS IV SOLN: INTRAVENOUS | Status: DC

## 2022-01-03 MED ORDER — DEXAMETHASONE SODIUM PHOSPHATE 10 MG/ML IJ SOLN
INTRAMUSCULAR | Status: DC | PRN
  Administered 2022-01-03: 10 mg via INTRAVENOUS

## 2022-01-03 MED ORDER — LIDOCAINE 2% (20 MG/ML) 5 ML SYRINGE
INTRAMUSCULAR | Status: DC | PRN
  Administered 2022-01-03: 80 mg via INTRAVENOUS

## 2022-01-03 MED ORDER — SODIUM CHLORIDE 0.9% FLUSH: 3.0000 mL | Freq: Two times a day (BID) | INTRAVENOUS | Status: DC

## 2022-01-03 MED ORDER — PROPOFOL 10 MG/ML IV BOLUS
INTRAVENOUS | Status: DC | PRN
  Administered 2022-01-03: 140 mg via INTRAVENOUS

## 2022-01-03 MED ORDER — CYCLOBENZAPRINE HCL 10 MG PO TABS
10.0000 mg | ORAL_TABLET | Freq: Three times a day (TID) | ORAL | Status: DC | PRN
  Administered 2022-01-03: 10 mg via ORAL
  Filled 2022-01-03: qty 1

## 2022-01-03 MED ORDER — CEFAZOLIN SODIUM-DEXTROSE 2-4 GM/100ML-% IV SOLN
2.0000 g | Freq: Three times a day (TID) | INTRAVENOUS | Status: AC
  Administered 2022-01-03: 2 g via INTRAVENOUS
  Filled 2022-01-03: qty 100

## 2022-01-03 MED ORDER — ACETAMINOPHEN 500 MG PO TABS: 1000.0000 mg | ORAL_TABLET | Freq: Once | ORAL | Status: AC

## 2022-01-03 MED ORDER — METHOCARBAMOL 500 MG PO TABS: 500.0000 mg | ORAL_TABLET | Freq: Four times a day (QID) | ORAL | Status: DC | PRN

## 2022-01-03 MED ORDER — ONDANSETRON HCL 4 MG/2ML IJ SOLN: 4.0000 mg | Freq: Four times a day (QID) | INTRAMUSCULAR | Status: DC | PRN

## 2022-01-03 MED ORDER — HYDROCODONE-ACETAMINOPHEN 5-325 MG PO TABS: 1.0000 | ORAL_TABLET | ORAL | Status: DC | PRN

## 2022-01-03 MED ORDER — ORAL CARE MOUTH RINSE: 15.0000 mL | Freq: Once | OROMUCOSAL | Status: AC

## 2022-01-03 MED ORDER — SUGAMMADEX SODIUM 200 MG/2ML IV SOLN
INTRAVENOUS | Status: DC | PRN
  Administered 2022-01-03: 200 mg via INTRAVENOUS

## 2022-01-03 MED ORDER — ACETAMINOPHEN 325 MG PO TABS
650.0000 mg | ORAL_TABLET | ORAL | Status: DC | PRN
  Administered 2022-01-03 – 2022-01-04 (×3): 650 mg via ORAL
  Filled 2022-01-03 (×3): qty 2

## 2022-01-03 MED ORDER — PANTOPRAZOLE SODIUM 40 MG PO TBEC
40.0000 mg | DELAYED_RELEASE_TABLET | Freq: Every day | ORAL | Status: DC
  Administered 2022-01-03 – 2022-01-04 (×2): 40 mg via ORAL
  Filled 2022-01-03 (×2): qty 1

## 2022-01-03 MED ORDER — ONDANSETRON HCL 4 MG PO TABS: 4.0000 mg | ORAL_TABLET | Freq: Four times a day (QID) | ORAL | Status: DC | PRN

## 2022-01-03 MED ORDER — FENTANYL CITRATE (PF) 100 MCG/2ML IJ SOLN
25.0000 ug | INTRAMUSCULAR | Status: DC | PRN
  Administered 2022-01-03 (×3): 50 ug via INTRAVENOUS

## 2022-01-03 MED ORDER — BUPIVACAINE HCL (PF) 0.25 % IJ SOLN
INTRAMUSCULAR | Status: AC
  Filled 2022-01-03: qty 30

## 2022-01-03 MED ORDER — ACETAMINOPHEN 325 MG PO TABS: 650.0000 mg | ORAL_TABLET | ORAL | Status: DC | PRN

## 2022-01-03 MED ORDER — ACETAMINOPHEN 650 MG RE SUPP: 650.0000 mg | RECTAL | Status: DC | PRN

## 2022-01-03 MED ORDER — ROCURONIUM BROMIDE 10 MG/ML (PF) SYRINGE
PREFILLED_SYRINGE | INTRAVENOUS | Status: AC
  Filled 2022-01-03: qty 10

## 2022-01-03 MED ORDER — CEFAZOLIN SODIUM-DEXTROSE 2-4 GM/100ML-% IV SOLN
INTRAVENOUS | Status: AC
  Filled 2022-01-03: qty 100

## 2022-01-03 MED ORDER — METHOCARBAMOL 500 MG PO TABS
500.0000 mg | ORAL_TABLET | Freq: Four times a day (QID) | ORAL | Status: DC | PRN
  Administered 2022-01-03 – 2022-01-04 (×2): 500 mg via ORAL
  Filled 2022-01-03 (×2): qty 1

## 2022-01-03 MED ORDER — PROMETHAZINE HCL 25 MG/ML IJ SOLN: 6.2500 mg | INTRAMUSCULAR | Status: DC | PRN

## 2022-01-03 MED ORDER — SODIUM CHLORIDE 0.9 % IV SOLN: 250.0000 mL | INTRAVENOUS | Status: DC

## 2022-01-03 MED ORDER — HEMOSTATIC AGENTS (NO CHARGE) OPTIME
TOPICAL | Status: DC | PRN
  Administered 2022-01-03: 1 via TOPICAL

## 2022-01-03 MED ORDER — BUPIVACAINE HCL (PF) 0.25 % IJ SOLN
INTRAMUSCULAR | Status: DC | PRN
  Administered 2022-01-03: 10 mL

## 2022-01-03 MED ORDER — CHLORHEXIDINE GLUCONATE 0.12 % MT SOLN
OROMUCOSAL | Status: AC
  Administered 2022-01-03: 15 mL via OROMUCOSAL
  Filled 2022-01-03: qty 15

## 2022-01-03 MED ORDER — PROPOFOL 10 MG/ML IV BOLUS
INTRAVENOUS | Status: AC
  Filled 2022-01-03: qty 20

## 2022-01-03 MED ORDER — METHOCARBAMOL 1000 MG/10ML IJ SOLN: 500.0000 mg | Freq: Four times a day (QID) | INTRAVENOUS | Status: DC | PRN

## 2022-01-03 MED ORDER — ONDANSETRON HCL 4 MG/2ML IJ SOLN
INTRAMUSCULAR | Status: DC | PRN
  Administered 2022-01-03: 4 mg via INTRAVENOUS

## 2022-01-03 MED ORDER — LIDOCAINE-EPINEPHRINE 1 %-1:100000 IJ SOLN
INTRAMUSCULAR | Status: DC | PRN
  Administered 2022-01-03: 7 mL

## 2022-01-03 MED ORDER — LIDOCAINE-EPINEPHRINE 1 %-1:100000 IJ SOLN
INTRAMUSCULAR | Status: AC
  Filled 2022-01-03: qty 1

## 2022-01-03 MED ORDER — PANTOPRAZOLE SODIUM 40 MG IV SOLR: 40.0000 mg | Freq: Every day | INTRAVENOUS | Status: DC

## 2022-01-03 MED ORDER — CHLORHEXIDINE GLUCONATE 0.12 % MT SOLN: 15.0000 mL | Freq: Once | OROMUCOSAL | Status: AC

## 2022-01-03 MED ORDER — GABAPENTIN 600 MG PO TABS
600.0000 mg | ORAL_TABLET | Freq: Four times a day (QID) | ORAL | Status: DC
  Administered 2022-01-03 – 2022-01-04 (×3): 600 mg via ORAL
  Filled 2022-01-03 (×3): qty 1

## 2022-01-03 MED ORDER — ALUM & MAG HYDROXIDE-SIMETH 200-200-20 MG/5ML PO SUSP: 30.0000 mL | Freq: Four times a day (QID) | ORAL | Status: DC | PRN

## 2022-01-03 MED ORDER — LIDOCAINE 2% (20 MG/ML) 5 ML SYRINGE
INTRAMUSCULAR | Status: AC
  Filled 2022-01-03: qty 5

## 2022-01-03 MED ORDER — MIDAZOLAM HCL 2 MG/2ML IJ SOLN
INTRAMUSCULAR | Status: DC | PRN
  Administered 2022-01-03: 2 mg via INTRAVENOUS

## 2022-01-03 MED ORDER — HYDROCODONE-ACETAMINOPHEN 5-325 MG PO TABS: 2.0000 | ORAL_TABLET | ORAL | Status: DC | PRN

## 2022-01-03 MED ORDER — FENTANYL CITRATE (PF) 250 MCG/5ML IJ SOLN
INTRAMUSCULAR | Status: AC
  Filled 2022-01-03: qty 5

## 2022-01-03 SURGICAL SUPPLY — 55 items
ADH SKN CLS APL DERMABOND .7 (GAUZE/BANDAGES/DRESSINGS) ×1
APL SKNCLS STERI-STRIP NONHPOA (GAUZE/BANDAGES/DRESSINGS) ×1
BAG COUNTER SPONGE SURGICOUNT (BAG) ×3 IMPLANT
BAG SPNG CNTER NS LX DISP (BAG) ×2
BAND INSRT 18 STRL LF DISP RB (MISCELLANEOUS) ×2
BAND RUBBER #18 3X1/16 STRL (MISCELLANEOUS) ×4 IMPLANT
BENZOIN TINCTURE PRP APPL 2/3 (GAUZE/BANDAGES/DRESSINGS) ×2 IMPLANT
BLADE CLIPPER SURG (BLADE) IMPLANT
BLADE SURG 11 STRL SS (BLADE) ×2 IMPLANT
BUR CUTTER 7.0 ROUND (BURR) ×2 IMPLANT
BUR MATCHSTICK NEURO 3.0 LAGG (BURR) ×2 IMPLANT
CANISTER SUCT 3000ML PPV (MISCELLANEOUS) ×2 IMPLANT
CARTRIDGE OIL MAESTRO DRILL (MISCELLANEOUS) ×1 IMPLANT
DECANTER SPIKE VIAL GLASS SM (MISCELLANEOUS) ×1 IMPLANT
DERMABOND ADVANCED (GAUZE/BANDAGES/DRESSINGS) ×1
DERMABOND ADVANCED .7 DNX12 (GAUZE/BANDAGES/DRESSINGS) ×1 IMPLANT
DIFFUSER DRILL AIR PNEUMATIC (MISCELLANEOUS) ×2 IMPLANT
DRAPE HALF SHEET 40X57 (DRAPES) IMPLANT
DRAPE LAPAROTOMY 100X72X124 (DRAPES) ×2 IMPLANT
DRAPE MICROSCOPE LEICA (MISCELLANEOUS) ×2 IMPLANT
DRAPE SURG 17X23 STRL (DRAPES) ×2 IMPLANT
DRSG OPSITE POSTOP 3X4 (GAUZE/BANDAGES/DRESSINGS) ×1 IMPLANT
DRSG OPSITE POSTOP 4X6 (GAUZE/BANDAGES/DRESSINGS) ×1 IMPLANT
DURAPREP 26ML APPLICATOR (WOUND CARE) ×2 IMPLANT
ELECT REM PT RETURN 9FT ADLT (ELECTROSURGICAL) ×2
ELECTRODE REM PT RTRN 9FT ADLT (ELECTROSURGICAL) ×1 IMPLANT
GAUZE 4X4 16PLY ~~LOC~~+RFID DBL (SPONGE) IMPLANT
GAUZE SPONGE 4X4 12PLY STRL (GAUZE/BANDAGES/DRESSINGS) ×2 IMPLANT
GLOVE EXAM NITRILE XL STR (GLOVE) IMPLANT
GLOVE SURG ENC MOIS LTX SZ7 (GLOVE) IMPLANT
GLOVE SURG ENC MOIS LTX SZ8 (GLOVE) ×2 IMPLANT
GLOVE SURG UNDER LTX SZ8.5 (GLOVE) ×2 IMPLANT
GLOVE SURG UNDER POLY LF SZ7 (GLOVE) IMPLANT
GOWN STRL REUS W/ TWL LRG LVL3 (GOWN DISPOSABLE) ×1 IMPLANT
GOWN STRL REUS W/ TWL XL LVL3 (GOWN DISPOSABLE) ×2 IMPLANT
GOWN STRL REUS W/TWL 2XL LVL3 (GOWN DISPOSABLE) IMPLANT
GOWN STRL REUS W/TWL LRG LVL3 (GOWN DISPOSABLE) ×2
GOWN STRL REUS W/TWL XL LVL3 (GOWN DISPOSABLE) ×4
KIT BASIN OR (CUSTOM PROCEDURE TRAY) ×2 IMPLANT
KIT TURNOVER KIT B (KITS) ×2 IMPLANT
NDL SPNL 22GX3.5 QUINCKE BK (NEEDLE) ×1 IMPLANT
NEEDLE HYPO 22GX1.5 SAFETY (NEEDLE) ×2 IMPLANT
NEEDLE SPNL 22GX3.5 QUINCKE BK (NEEDLE) ×2 IMPLANT
NS IRRIG 1000ML POUR BTL (IV SOLUTION) ×2 IMPLANT
OIL CARTRIDGE MAESTRO DRILL (MISCELLANEOUS) ×2
PACK LAMINECTOMY NEURO (CUSTOM PROCEDURE TRAY) ×2 IMPLANT
SPONGE SURGIFOAM ABS GEL SZ50 (HEMOSTASIS) ×2 IMPLANT
STRIP CLOSURE SKIN 1/2X4 (GAUZE/BANDAGES/DRESSINGS) ×2 IMPLANT
SUT VIC AB 0 CT1 18XCR BRD8 (SUTURE) ×1 IMPLANT
SUT VIC AB 0 CT1 8-18 (SUTURE) ×2
SUT VIC AB 2-0 CT1 18 (SUTURE) ×2 IMPLANT
SUT VICRYL 4-0 PS2 18IN ABS (SUTURE) ×2 IMPLANT
TOWEL GREEN STERILE (TOWEL DISPOSABLE) ×2 IMPLANT
TOWEL GREEN STERILE FF (TOWEL DISPOSABLE) ×2 IMPLANT
WATER STERILE IRR 1000ML POUR (IV SOLUTION) ×2 IMPLANT

## 2022-01-03 NOTE — H&P (Signed)
Rick [name] is an 51 y.o. male.   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  Due to patient progression of clinical syndrome imaging findings and failed conservative treatment I recommended laminectomy for discectomy at L2-3.  I extensively over the risks and benefits of the operation with him as well as perioperative course expectations of outcome and alternatives to surgery and he understood and agreed to proceed forward.  Past Medical History:  Diagnosis Date   Back pain    BOILS, RECURRENT 04/13/2007   Qualifier: Diagnosis of  By: Rick Priest MD, [place]     Depression    GASTROESOPHAGEAL REFLUX, NO ESOPHAGITIS 02/25/2007   Qualifier: Diagnosis of  By: Rick [name]     MIGRAINE, UNSPEC., W/O INTRACTABLE MIGRAINE 02/25/2007   Qualifier: Diagnosis of  By: Rick [name]     Neck pain    OSTEOARTHRITIS OF SPINE, NOS 02/25/2007   Qualifier: Diagnosis of  By: Rick [name]     PAC (premature atrial contraction) 01/28/2021   Palpitations 10/02/2020    Past Surgical History:  Procedure Laterality Date   NECK SURGERY     x 3   TONSILLECTOMY Bilateral 1976    Family History  Problem Relation Age of Onset   Cancer Mother    Cancer Father    Cancer Maternal Grandfather    Social History:  reports that he has been smoking cigarettes. He has a 3.50 pack-year smoking history. He has never used smokeless tobacco. He reports that he does not currently use alcohol. He reports that he does not use drugs.  Allergies: No Known Allergies  Medications Prior to Admission  Medication Sig Dispense Refill   gabapentin (NEURONTIN) 600 MG tablet Take 600 mg by mouth 4 (four) times daily.     Oxycodone HCl 10 MG TABS Take 10 mg by mouth daily as needed (pain).     buPROPion (WELLBUTRIN  SR) 150 MG 12 hr tablet Take 1 tablet (150 mg total) by mouth 2 (two) times daily. Take 1 tablet daily for three days then increase to twice daily for 3 months. Take evening dose prior to 6pm. (Patient not taking: Reported on 12/18/2021) 180 tablet 0   metoprolol tartrate (LOPRESSOR) 25 MG tablet Take 0.5 tablets (12.5 mg total) by mouth 2 (two) times daily as needed. (Patient not taking: Reported on 12/18/2021) 45 tablet 3    No results found for this or any previous visit (from the past 48 hour(s)). No results found.  Review of Systems  Musculoskeletal:  Positive for arthralgias and back pain.  Neurological:  Positive for numbness.   There were no vitals taken for this visit. Physical Exam HENT:     Head: Normocephalic.     Right Ear: Tympanic membrane normal.     Nose: Nose normal.     Mouth/Throat:     Mouth: Mucous membranes are moist.  Cardiovascular:     Rate and Rhythm: Normal rate.  Pulmonary:     Effort: Pulmonary effort is normal.  Abdominal:     General: Abdomen is flat.  Musculoskeletal:        General: Normal range of motion.  Skin:    General: Skin is warm.  Neurological:     Mental Status: He is alert.     Comments: Awake and  alert strength 5 out of 5 iliopsoas, quads, hamstrings, gastrocs, tibialis, and EHL.     Assessment/Plan 51 year old presents for lumbar limited microdiscectomy L2-3 on the right  Rick Dollar, MD 01/03/2022, 12:52 PM

## 2022-01-03 NOTE — Transfer of Care (Signed)
Immediate Anesthesia Transfer of Care Note  Patient: Rick [name]  Procedure(s) Performed: Microdiscectomy - L2-L3 - right (Right: Back)  Patient Location: PACU  Anesthesia Type:General  Level of Consciousness: drowsy and patient cooperative  Airway & Oxygen Therapy: Patient Spontanous Breathing  Post-op Assessment: Report given to RN and Post -op Vital signs reviewed and stable  Post vital signs: Reviewed and stable  Last Vitals:  Vitals Value Taken Time  BP 153/88 01/03/22 1517  Temp    Pulse 79 01/03/22 1519  Resp 34 01/03/22 1519  SpO2 100 % 01/03/22 1519  Vitals shown include unvalidated device data.  Last Pain:  Vitals:   01/03/22 1252  TempSrc: Oral         Complications: No notable events documented.

## 2022-01-03 NOTE — Anesthesia Procedure Notes (Signed)
Procedure Name: Intubation Date/Time: 01/03/2022 1:46 PM Performed by: Rosiland Oz, CRNA Pre-anesthesia Checklist: Patient identified, Emergency Drugs available, Suction available, Patient being monitored and Timeout performed Patient Re-evaluated:Patient Re-evaluated prior to induction Oxygen Delivery Method: Circle system utilized Preoxygenation: Pre-oxygenation with 100% oxygen Induction Type: IV induction Ventilation: Mask ventilation without difficulty Laryngoscope Size: Glidescope and 4 Grade View: Grade I Tube type: Oral Tube size: 7.5 mm Number of attempts: 1 Airway Equipment and Method: Stylet Placement Confirmation: ETT inserted through vocal cords under direct vision, positive ETCO2 and breath sounds checked- equal and bilateral Secured at: 23 cm Tube secured with: Tape Dental Injury: Teeth and Oropharynx as per pre-operative assessment

## 2022-01-03 NOTE — Op Note (Signed)
Preoperative diagnosis: Right L2-L3 radiculopathy from herniated nucleus pulposus L2-3 right  Postoperative diagnosis: Same  Procedure: Lumbar laminectomy and discectomy L2-3 on the right with microdissection of the right L2 and L3 nerve root microscopic discectomy  Surgeon: Jillyn Hidden Joelee Snoke  Assistant: Julien Girt  Anesthesia: General  EBL: Minimal  HPI: 51 year old gentleman progressive worsening back right hip and leg pain rating down L2-L3 nerve root pattern work-up revealed disc herniation at L2-3 with a superior fragment displacing both the undersurface of the L2 nerve root and the L3 nerve root.  Due to the patient's progressive clinical syndrome imaging findings and failed conservative treatment I recommended lumbar microdiscectomy at L2-3 on the right I extensively went over the risks and benefits of the operation with the patient as well as perioperative course expectations of outcome and alternatives of surgery and he understood and agreed to proceed forward.  Operative procedure: Patient was brought into the OR was induced under general anesthesia positioned prone Wilson frame his back was prepped and draped in routine sterile fashion preoperative x-ray localized the appropriate level.  So after infiltration of 10 cc lidocaine with epi midline incision made and Bovie cautery was used take down the subcutaneous tissue and subperiosteal dissection was carried out lamina of L2 and L3 on the right.  Intraoperative x-ray confirmed identification appropriate level.  So the inferior aspect of lamina L2 medial facet complex and the superior aspect lamina L3 was drilled out with a high-speed drill laminotomy was begun with a 2 and 3 Miller Kerrison punch.  I extended and took off about the inferior half to two thirds of the lamina of L2 so I could identify the medial aspect of the L2 pedicle.  Under microscopic illumination, I then under bit the medial facet complex identified the L2 and L3 nerve root  identified the disc base coagulate the epidural veins and there was extensive mount of fibrosis and inflammatory response in the annulus disc fragment still partially contained within the annulus superiorly disc underneath the proximal and inferior aspect of the II nerve root.  This was all teased away decompressing the L2 nerve root in the foramen as well as entering the disc base incised the disc base and cleaned out the disc base and decompressing thecal sac and the L3 nerve root.  At the end of discectomy there is no further stenosis either on the L2 or L3 nerve root.  The wounds copiously irrigated medical hemostasis was maintained Gelfoam was ON top of the dura in the room wound was closed in layers with interrupted Vicryl and a running 4 subcuticular Dermabond benzoin Steri-Strips and a sterile dressing was applied patient recovery in stable condition.  At the end the case all needle count sponge counts were correct.

## 2022-01-04 DIAGNOSIS — M5116 Intervertebral disc disorders with radiculopathy, lumbar region: Secondary | ICD-10-CM | POA: Diagnosis not present

## 2022-01-04 MED ORDER — OXYCODONE HCL 10 MG PO TABS: 10.0000 mg | ORAL_TABLET | ORAL | 0 refills | Status: DC | PRN

## 2022-01-04 MED ORDER — CYCLOBENZAPRINE HCL 10 MG PO TABS: 10.0000 mg | ORAL_TABLET | Freq: Three times a day (TID) | ORAL | 0 refills | Status: DC | PRN

## 2022-01-04 NOTE — Evaluation (Signed)
Occupational Therapy Evaluation Patient Details Name: Rick [name] MRN: [id number] DOB: 11/17/1971 Today's Date: 01/04/2022   History of Present Illness Pt is a 51 y.o. male who presented 01/03/22 for lumbar laminectomy and discectomy L2-3 on the right with microdissection of the right L2 and L3 nerve root microscopic discectomy secondary to progressive worsening back and R leg pain from disc herniation at L2-3 with a free fragment displacing the L2 and L3 nerve root. PMH: depression, migraine, OA of spine, PAC   Clinical Impression   Patient evaluated by Occupational Therapy with no further acute OT needs identified. All education has been completed and the patient has no further questions. Pt demonstrates good understanding of back precautions and safety with ADLs - able to complete mod I.  See below for any follow-up Occupational Therapy or equipment needs. OT is signing off. Thank you for this referral.       Recommendations for follow up therapy are one component of a multi-disciplinary discharge planning process, led by the attending physician.  Recommendations may be updated based on patient status, additional functional criteria and insurance authorization.   Follow Up Recommendations  No OT follow up    Assistance Recommended at Discharge Intermittent Supervision/Assistance  Patient can return home with the following Assistance with cooking/housework    Functional Status Assessment  Patient has had a recent decline in their functional status and demonstrates the ability to make significant improvements in function in a reasonable and predictable amount of time.  Equipment Recommendations  None recommended by OT    Recommendations for Other Services       Precautions / Restrictions Precautions Precautions: Back Precaution Booklet Issued: Yes (comment) Precaution Comments: Pt able to verbalize and demonstrate understanding of 3/3 precautions Required Braces or Orthoses:  (no  brace needed) Restrictions Weight Bearing Restrictions: No      Mobility Bed Mobility Overal bed mobility: Modified Independent Bed Mobility: Rolling;Sidelying to Sit Rolling: Supervision Sidelying to sit: Supervision;HOB elevated       General bed mobility comments: Cues for log roll technique and transition to sit, supervision for safety and compliance to precautions.    Transfers Overall transfer level: Modified independent Equipment used: None Transfers: Sit to/from Stand Sit to Stand: Supervision           General transfer comment: Pt able to power up to stand with increased time, supervision for safety, no LOB.      Balance Overall balance assessment: No apparent balance deficits (not formally assessed)                                         ADL either performed or assessed with clinical judgement   ADL Overall ADL's : Needs assistance/impaired Eating/Feeding: Independent   Grooming: Wash/dry hands;Wash/dry face;Oral care;Brushing hair;Modified independent;Standing Grooming Details (indicate cue type and reason): reviewed safe techniques to avoid bending Upper Body Bathing: Modified independent;Sitting   Lower Body Bathing: Modified independent;Sit to/from stand;Adhering to back precautions Lower Body Bathing Details (indicate cue type and reason): discussed use of LH sponge/brush, or alternative methods for bathing feet in shower Upper Body Dressing : Modified independent;Sitting   Lower Body Dressing: Modified independent;Adhering to back precautions;Sit to/from stand Lower Body Dressing Details (indicate cue type and reason): able to perform figure 4 Toilet Transfer: Modified Independent;Ambulation;Regular Toilet;Comfort height toilet   Toileting- Clothing Manipulation and Hygiene: Modified independent;Sit to/from stand  Tub/ Shower Transfer: Tub transfer;Modified independent;Ambulation;Adhering to back precautions Tub/Shower Transfer  Details (indicate cue type and reason): reviewed safe technique         Vision Baseline Vision/History: 1 Wears glasses Ability to See in Adequate Light: 0 Adequate       Perception     Praxis      Pertinent Vitals/Pain Pain Assessment: 0-10 Pain Score: 7  Faces Pain Scale: Hurts even more Pain Location: back Pain Descriptors / Indicators: Discomfort;Operative site guarding Pain Intervention(s): Monitored during session;Premedicated before session     Hand Dominance     Extremity/Trunk Assessment Upper Extremity Assessment Upper Extremity Assessment: Overall WFL for tasks assessed   Lower Extremity Assessment Lower Extremity Assessment: RLE deficits/detail RLE Deficits / Details: MMT scores of 4- quads, 4- anterior tibialis compared to 5 on L; slight numbness at R anterior thigh, but improved since surgery RLE Sensation: decreased light touch RLE Coordination: WNL   Cervical / Trunk Assessment Cervical / Trunk Assessment: Back Surgery   Communication Communication Communication: No difficulties   Cognition Arousal/Alertness: Awake/alert Behavior During Therapy: WFL for tasks assessed/performed Overall Cognitive Status: Within Functional Limits for tasks assessed                                       General Comments  reviewed safety with IADLs, laptop/computer positioning for good ergonomics    Exercises     Shoulder Instructions      Home Living Family/patient expects to be discharged to:: Private residence Living Arrangements: Spouse/significant other Available Help at Discharge: Family;Available 24 hours/day Type of Home: House Home Access: Stairs to enter Entergy CorporationEntrance Stairs-Number of Steps: 2 Entrance Stairs-Rails:  (unsure of side but does have rails) Home Layout: One level     Bathroom Shower/Tub: Chief Strategy OfficerTub/shower unit   Bathroom Toilet: Standard     Home Equipment: Rollator (4 wheels);Rolling Walker (2 wheels);Cane - single  point;BSC/3in1;Shower seat;Grab bars - tub/shower;Adaptive equipment Adaptive Equipment: Reacher        Prior Functioning/Environment Prior Level of Function : Independent/Modified Independent             Mobility Comments: Pt does not use an AD. 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        OT Problem List: Pain;Decreased knowledge of precautions      OT Treatment/Interventions:      OT Goals(Current goals can be found in the care plan section) Acute Rehab OT Goals Patient Stated Goal: to have less pain OT Goal Formulation: All assessment and education complete, DC therapy  OT Frequency:      Co-evaluation              AM-PAC OT "6 Clicks" Daily Activity     Outcome Measure Help from another person eating meals?: None Help from another person taking care of personal grooming?: None Help from another person toileting, which includes using toliet, bedpan, or urinal?: None Help from another person bathing (including washing, rinsing, drying)?: None Help from another person to put on and taking off regular upper body clothing?: None Help from another person to put on and taking off regular lower body clothing?: None 6 Click Score: 24   End of Session Nurse Communication: Mobility status  Activity Tolerance: Patient tolerated treatment well Patient left: in bed;with call bell/phone within  reach  OT Visit Diagnosis: Pain Pain - part of body:  (back)                Time: 9563-8756 OT Time Calculation (min): 13 min Charges:  OT General Charges $OT Visit: 1 Visit OT Evaluation $OT Eval Low Complexity: 1 Low  Eber Jones., OTR/L Acute Rehabilitation Services Pager [phone] Office [phone]   Jeani Hawking M 01/04/2022, 9:49 AM

## 2022-01-04 NOTE — Anesthesia Postprocedure Evaluation (Signed)
Anesthesia Post Note  Patient: Rick [name]  Procedure(s) Performed: Microdiscectomy - L2-L3 - right (Right: Back)     Patient location during evaluation: PACU Anesthesia Type: General Level of consciousness: awake and alert Pain management: pain level controlled Vital Signs Assessment: post-procedure vital signs reviewed and stable Respiratory status: spontaneous breathing, nonlabored ventilation, respiratory function stable and patient connected to nasal cannula oxygen Cardiovascular status: blood pressure returned to baseline and stable Postop Assessment: no apparent nausea or vomiting Anesthetic complications: no   No notable events documented.  Last Vitals:  Vitals:   01/03/22 2329 01/04/22 0354  BP: 106/64 100/75  Pulse: (!) 59 (!) 56  Resp: 18 18  Temp: 36.4 C (!) 36.3 C  SpO2: 97% 98%    Last Pain:  Vitals:   01/04/22 0504  TempSrc:   PainSc: 6                  Marisah Laker S

## 2022-01-04 NOTE — Progress Notes (Signed)
Patient with no c/o pain at this time of discharge. Discharged instructions given to patient with stated understanding of instructions given.Patient has an appointment with Dr. Wynetta Emery in 2 Weeks

## 2022-01-04 NOTE — Evaluation (Signed)
Physical Therapy Evaluation Patient Details Name: Rick [name] MRN: [id number] DOB: 1971/12/22 Today's Date: 01/04/2022  History of Present Illness  Pt is a 51 y.o. male who presented 01/03/22 for lumbar laminectomy and discectomy L2-3 on the right with microdissection of the right L2 and L3 nerve root microscopic discectomy secondary to progressive worsening back and R leg pain from disc herniation at L2-3 with a free fragment displacing the L2 and L3 nerve root. PMH: depression, migraine, OA of spine, PAC   Clinical Impression  Pt presents with condition above and deficits mentioned below, see PT Problem List. PTA, he was independent without an AD, working, and living with his significant other in a 1-level house with 2 STE. Currently, he displays deficits in R leg strength, R thigh sensation (improved from prior to surgery though), and activity tolerance due to back pain following surgery. He is able to ambulate without UE support safely, but educated pt to use his rollator at d/c for pain management. Educated pt on back precautions, regular mobility/repositioning, and car transfers. Will continue to follow acutely to maximize his return to baseline and further educate him on his precautions, but anticipate he will not need follow-up PT at d/c as he is close to his baseline and will likely improve quickly as his pain subsides.       Recommendations for follow up therapy are one component of a multi-disciplinary discharge planning process, led by the attending physician.  Recommendations may be updated based on patient status, additional functional criteria and insurance authorization.  Follow Up Recommendations No PT follow up    Assistance Recommended at Discharge Set up Supervision/Assistance  Patient can return home with the following  Help with stairs or ramp for entrance    Equipment Recommendations None recommended by PT  Recommendations for Other Services       Functional Status  Assessment Patient has had a recent decline in their functional status and demonstrates the ability to make significant improvements in function in a reasonable and predictable amount of time.     Precautions / Restrictions Precautions Precautions: Back Precaution Booklet Issued: Yes (comment) Precaution Comments: Reviewed BLT precautions, good compliance Required Braces or Orthoses:  (no brace needed) Restrictions Weight Bearing Restrictions: No      Mobility  Bed Mobility Overal bed mobility: Needs Assistance Bed Mobility: Rolling;Sidelying to Sit Rolling: Supervision Sidelying to sit: Supervision;HOB elevated       General bed mobility comments: Cues for log roll technique and transition to sit, supervision for safety and compliance to precautions.    Transfers Overall transfer level: Needs assistance Equipment used: None Transfers: Sit to/from Stand Sit to Stand: Supervision           General transfer comment: Pt able to power up to stand with increased time, supervision for safety, no LOB.    Ambulation/Gait Ambulation/Gait assistance: Supervision;Min guard Gait Distance (Feet): 330 Feet Assistive device: None Gait Pattern/deviations: Step-through pattern;Decreased stride length;Trunk flexed Gait velocity: reduced Gait velocity interpretation: <1.8 ft/sec, indicate of risk for recurrent falls   General Gait Details: Pt with anterior trunk flexion posture due to back pain following surgery, cues provided to straighten as able. Slow, but steady gait with no LOB, min guard-supervision for safety. Educated pt to use his rollator for pain management at d/c.  Stairs            Wheelchair Mobility    Modified Rankin (Stroke Patients Only)       Balance Overall balance assessment:  No apparent balance deficits (not formally assessed)                                           Pertinent Vitals/Pain Pain Assessment: Faces Faces Pain  Scale: Hurts even more Pain Location: back Pain Descriptors / Indicators: Discomfort;Operative site guarding Pain Intervention(s): Limited activity within patient's tolerance;Monitored during session;Repositioned    Home Living Family/patient expects to be discharged to:: Private residence Living Arrangements: Spouse/significant other Available Help at Discharge: Family;Available 24 hours/day Type of Home: House Home Access: Stairs to enter Entrance Stairs-Rails:  (unsure of side but does have rails) Entrance Stairs-Number of Steps: 2   Home Layout: One level Home Equipment: Rollator (4 wheels);Rolling Walker (2 wheels);Cane - single point;BSC/3in1;Shower seat;Grab bars - tub/shower      Prior Function Prior Level of Function : Independent/Modified Independent             Mobility Comments: Pt does not use an AD. 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     Hand Dominance        Extremity/Trunk Assessment   Upper Extremity Assessment Upper Extremity Assessment: Defer to OT evaluation    Lower Extremity Assessment Lower Extremity Assessment: RLE deficits/detail RLE Deficits / Details: MMT scores of 4- quads, 4- anterior tibialis compared to 5 on L; slight numbness at R anterior thigh, but improved since surgery RLE Sensation: decreased light touch RLE Coordination: WNL    Cervical / Trunk Assessment Cervical / Trunk Assessment: Back Surgery  Communication   Communication: No difficulties  Cognition Arousal/Alertness: Awake/alert Behavior During Therapy: WFL for tasks assessed/performed Overall Cognitive Status: Within Functional Limits for tasks assessed                                          General Comments General comments (skin integrity, edema, etc.): Educated pt on proper positioning in recliner, regular  mobility/repositioning, and car transfers    Exercises     Assessment/Plan    PT Assessment Patient needs continued PT services  PT Problem List Decreased strength;Decreased activity tolerance;Decreased mobility;Decreased knowledge of precautions;Pain       PT Treatment Interventions DME instruction;Gait training;Functional mobility training;Stair training;Therapeutic activities;Therapeutic exercise;Balance training;Neuromuscular re-education;Patient/family education    PT Goals (Current goals can be found in the Care Plan section)  Acute Rehab PT Goals Patient Stated Goal: to get better PT Goal Formulation: With patient Time For Goal Achievement: 01/11/22 Potential to Achieve Goals: Good    Frequency Min 5X/week     Co-evaluation               AM-PAC PT "6 Clicks" Mobility  Outcome Measure Help needed turning from your back to your side while in a flat bed without using bedrails?: A Marple Help needed moving from lying on your back to sitting on the side of a flat bed without using bedrails?: A [place] Help needed moving to and from a bed to a chair (including a wheelchair)?: A [place] Help needed standing up from a chair using your arms (e.g., wheelchair or bedside chair)?: A [place] Help needed to walk in hospital room?: A Abebe Help needed climbing 3-5 steps with a railing? : A  [place] 6 Click Score: 18    End of Session Equipment Utilized During Treatment: Gait belt Activity Tolerance: Patient tolerated treatment well;Patient limited by pain Patient left: in bed;with call bell/phone within reach Nurse Communication: Mobility status PT Visit Diagnosis: Unsteadiness on feet (R26.81);Other abnormalities of gait and mobility (R26.89);Muscle weakness (generalized) (M62.81);Difficulty in walking, not elsewhere classified (R26.2);Pain Pain - part of body:  (back)    Time: 5621-3086 PT Time Calculation (min) (ACUTE ONLY): 13 min   Charges:   PT Evaluation $PT Eval  Low Complexity: 1 Low          Raymond Gurney, PT, DPT Acute Rehabilitation Services  Pager: [phone] Office: [phone]   Jewel Baize 01/04/2022, 9:29 AM

## 2022-01-04 NOTE — Care Management (Signed)
Transition of Care Department (TOC) has reviewed patient and no TOC needs have been identified at this time. °DME needs will be fulfilled by unit staff °

## 2022-01-04 NOTE — Discharge Summary (Signed)
Physician Discharge Summary  Patient ID: Rick [name] MRN: [id number] DOB/AGE: 1971/02/10 51 y.o.  Admit date: 01/03/2022 Discharge date: 01/04/2022  Admission Diagnoses: Lumbar disc, lumbar radiculopathy  Discharge Diagnoses: The same Principal Problem:   Lumbar disc herniation Active Problems:   HNP (herniated nucleus pulposus), lumbar   Discharged Condition: good  Hospital Course: Dr. Wynetta Emery performed a right L2-3 discectomy on the patient on 01/03/2029.  The patient's postoperative course was unremarkable.  On postoperative day 1 he requested discharge home.  He was given verbal and written discharge instructions were answered.  Consults: PT, OT, care management Significant Diagnostic Studies: None Treatments: Right L2-3 discectomy using microdissection Discharge Exam: Blood pressure 120/66, pulse (!) 59, temperature 97.9 F (36.6 C), temperature source Oral, resp. rate 18, height 5\' 10"  (1.778 m), weight 74.8 kg, SpO2 99 %. The patient is alert and pleasant.  He looks strength is normal.  Disposition: Home  Discharge Instructions     Call MD for:  difficulty breathing, headache or visual disturbances   Complete by: As directed    Call MD for:  extreme fatigue   Complete by: As directed    Call MD for:  hives   Complete by: As directed    Call MD for:  persistant dizziness or light-headedness   Complete by: As directed    Call MD for:  persistant nausea and vomiting   Complete by: As directed    Call MD for:  redness, tenderness, or signs of infection (pain, swelling, redness, odor or green/yellow discharge around incision site)   Complete by: As directed    Call MD for:  severe uncontrolled pain   Complete by: As directed    Call MD for:  temperature >100.4   Complete by: As directed    Diet - low sodium heart healthy   Complete by: As directed    Discharge instructions   Complete by: As directed    Call [phone] for a followup appointment. Take a stool  softener while you are using pain medications.   Driving Restrictions   Complete by: As directed    Do not drive for 2 weeks.   Incentive spirometry RT   Complete by: As directed    Increase activity slowly   Complete by: As directed    Lifting restrictions   Complete by: As directed    Do not lift more than 5 pounds. No excessive bending or twisting.   May shower / Bathe   Complete by: As directed    Remove the dressing for 3 days after surgery.  You may shower, but leave the incision alone.   Remove dressing in 48 hours   Complete by: As directed       Allergies as of 01/04/2022   No Known Allergies      Medication List     TAKE these medications    buPROPion 150 MG 12 hr tablet Commonly known as: Wellbutrin SR Take 1 tablet (150 mg total) by mouth 2 (two) times daily. Take 1 tablet daily for three days then increase to twice daily for 3 months. Take evening dose prior to 6pm.   cyclobenzaprine 10 MG tablet Commonly known as: FLEXERIL Take 1 tablet (10 mg total) by mouth 3 (three) times daily as needed for muscle spasms.   gabapentin 600 MG tablet Commonly known as: NEURONTIN Take 600 mg by mouth 4 (four) times daily.   metoprolol tartrate 25 MG tablet Commonly known as: LOPRESSOR Take 0.5 tablets (12.5  mg total) by mouth 2 (two) times daily as needed.   Oxycodone HCl 10 MG Tabs Take 1 tablet (10 mg total) by mouth every 3 (three) hours as needed for severe pain ((score 7 to 10)). What changed:  when to take this reasons to take this         Signed: Ophelia Charter 01/04/2022, 9:20 AM

## 2022-01-05 ENCOUNTER — Encounter (HOSPITAL_COMMUNITY): Payer: Self-pay | Admitting: Neurosurgery

## 2022-01-13 ENCOUNTER — Encounter (HOSPITAL_COMMUNITY): Payer: Self-pay | Admitting: Neurosurgery

## 2022-05-14 ENCOUNTER — Ambulatory Visit: Payer: Medicaid Other | Admitting: Family Medicine

## 2022-05-14 NOTE — Progress Notes (Deleted)
    SUBJECTIVE:   CHIEF COMPLAINT / HPI:   ***Shoulder pain: ***  PERTINENT  PMH / PSH: ***  OBJECTIVE:   There were no vitals taken for this visit. ***  General: NAD, pleasant, able to participate in exam Respiratory: No respiratory distress MSK: ***Shoulder, ***: No*** evidence of bony deformity, asymmetry, or muscle atrophy; No*** tenderness over long head of biceps (bicipital groove). No*** TTP at [facility] joint. Full*** active and passive range of motion (180 flex Huel Cote /150Abd /90ER /70IR), Thumb to ***T12 without significant tenderness. Strength 5***/5 throughout. No*** abnormal scapular function observed. Sensation intact. Peripheral pulses intact. Psych: Normal affect and mood  ASSESSMENT/PLAN:   No problem-specific Assessment & Plan notes found for this encounter.       Lurline Del, [place]

## 2022-05-28 NOTE — Progress Notes (Deleted)
    SUBJECTIVE:   CHIEF COMPLAINT / HPI:   ***Knee pain: ***  Cysts: Patient states he has two cysts, one present on his chest and one present on his left temple.  He states these have been there since***.  PERTINENT  PMH / PSH: ***  OBJECTIVE:   There were no vitals taken for this visit. ***  General: NAD, pleasant, able to participate in exam Respiratory: No respiratory distress MSK: Knee, ***: Inspection was negative*** for erythema, ecchymosis, and effusion. No*** obvious bony abnormalities or signs of osteophyte development. Palpation yielded no*** asymmetric warmth; No*** joint line tenderness; No*** condyle tenderness; No*** patellar tenderness; No*** patellar crepitus. Patellar and quadriceps tendons ***unremarkable, and no*** tenderness of the pes anserine bursa. No*** obvious Baker's cyst development. ROM ***normal in flexion (135 degrees) and extension (0 degrees). ***Normal hamstring and quadriceps strength. Neurovascularly intact ***bilaterally. Skin:*** Psych: Normal affect and mood  ASSESSMENT/PLAN:   No problem-specific Assessment & Plan notes found for this encounter.       Jackelyn Poling, DO [facility]

## 2022-05-29 ENCOUNTER — Ambulatory Visit: Payer: Medicaid Other | Admitting: Family Medicine

## 2022-06-03 ENCOUNTER — Encounter: Payer: Self-pay | Admitting: *Deleted

## 2022-06-19 ENCOUNTER — Ambulatory Visit: Payer: Medicaid Other | Admitting: Family Medicine

## 2022-06-19 NOTE — Progress Notes (Deleted)
    SUBJECTIVE:   CHIEF COMPLAINT / HPI:   ***Knee pain: ***  Cysts: Patient states he has two cysts, one present on his chest and one present on his left temple.  He states these have been there since***.  PERTINENT  PMH / PSH: ***  OBJECTIVE:   There were no vitals taken for this visit. ***  General: NAD, pleasant, able to participate in exam Respiratory: No respiratory distress MSK: Knee, ***: Inspection was negative*** for erythema, ecchymosis, and effusion. No*** obvious bony abnormalities or signs of osteophyte development. Palpation yielded no*** asymmetric warmth; No*** joint line tenderness; No*** condyle tenderness; No*** patellar tenderness; No*** patellar crepitus. Patellar and quadriceps tendons ***unremarkable, and no*** tenderness of the pes anserine bursa. No*** obvious Baker's cyst development. ROM ***normal in flexion (135 degrees) and extension (0 degrees). ***Normal hamstring and quadriceps strength. Neurovascularly intact ***bilaterally. Skin:*** Psych: Normal affect and mood  ASSESSMENT/PLAN:   No problem-specific Assessment & Plan notes found for this encounter.     Health maintenance: Recommended screening colonoscopy***.  We will screen for hyperlipidemia with lipid panel.   Jackelyn Poling, DO [facility]

## 2022-08-14 ENCOUNTER — Ambulatory Visit (INDEPENDENT_AMBULATORY_CARE_PROVIDER_SITE_OTHER): Payer: Medicaid Other | Admitting: Family Medicine

## 2022-08-14 VITALS — BP 130/74 | HR 75 | Ht 70.0 in | Wt 153.6 lb

## 2022-08-14 DIAGNOSIS — E1169 Type 2 diabetes mellitus with other specified complication: Secondary | ICD-10-CM

## 2022-08-14 DIAGNOSIS — L723 Sebaceous cyst: Secondary | ICD-10-CM

## 2022-08-14 DIAGNOSIS — E785 Hyperlipidemia, unspecified: Secondary | ICD-10-CM

## 2022-08-14 DIAGNOSIS — Z716 Tobacco abuse counseling: Secondary | ICD-10-CM | POA: Diagnosis not present

## 2022-08-14 DIAGNOSIS — R030 Elevated blood-pressure reading, without diagnosis of hypertension: Secondary | ICD-10-CM | POA: Diagnosis not present

## 2022-08-14 MED ORDER — AMLODIPINE BESYLATE 5 MG PO TABS: 5.0000 mg | ORAL_TABLET | Freq: Every day | ORAL | 3 refills | Status: DC

## 2022-08-14 MED ORDER — CHANTIX STARTING MONTH PAK 0.5 MG X 11 & 1 MG X 42 PO TBPK: ORAL_TABLET | ORAL | 0 refills | Status: DC

## 2022-08-14 NOTE — Patient Instructions (Addendum)
It was wonderful to see you today.  Please bring ALL of your medications with you to every visit.   Today we talked about:  Elevated blood pressure - we will start amlodipine (Norvasc) 38m daily. Whenever you're able, please check your blood pressures at home and follow up with [facility] one month. Cysts on head and chest - a referral was placed to Dermatology. You should receive a call to schedule an appointment. Smoking cessation - You should start taking Chantix as prescribed. I will give you a call on 8/24 to see if you've met your goal!   Tobacco use is damaging to your body. It increases your risk of stroke, heart attack, lung cancer, and serious lung disease in the future. It also reduces your fertility.   Quitting tobacco is the best thing for your health but is a challenge---nicotine, a chemical in cigarettes, is highly addictive.   You can call 1 800 QUIT NOW (1-854-317-2234)---you will be connected with a pArtist They can also mail you nicotine gums, lozenges, and patches to quit.   Ask me about patches (which you wear all day) and gums (which you use when you have a craving) to help you quit.   There are safe, effective medications to help you quit--  Varencline---also called Chantix---- is the most common medication used to help people stop smoking. It starts a low dose and is increased. I recommended choosing a quit date then starting the medication 8 days before this. Side effects include mild headache, difficulty sleeping, and odd dreams. The medication is typically very well tolerated.    Please follow up in 1 months   Thank you for choosing [facility]   Please call [phone] any questions about today's appointment.  Please be sure to schedule follow up at the front  desk before you leave today.   AAugust Albino MD  Family Medicine

## 2022-08-14 NOTE — Assessment & Plan Note (Addendum)
Asymptomatic but concerned given hx of smoking. 170/85, repeat 130/74 -Start amlodipine 5mg  qhs -f/u 54mo -encouraged to check blood pressure regularly at home and keep log, pt will buy cuff -Lipid panel, A1c today -Check BMP at next visit

## 2022-08-14 NOTE — Assessment & Plan Note (Signed)
0.25-0.5 packs per week, has been trying his best to quit  -Start Chantix today -Quit date: 8/24

## 2022-08-14 NOTE — Progress Notes (Signed)
    SUBJECTIVE:   CHIEF COMPLAINT / HPI:    Elevated blood pressure  -170/85 in office today, 130/74 on recheck -denies current chest pain, headache, vision changes, n/v   Cyst on chest and head -father had melanoma -sun exposure but wears sunscreen -has had similar cysts removed in the past (10-15 years ago)   Smoking cessation -Smokes 0.25-0.5 packs per week since about 2 years ago; has been making effort to quit. Tried Wellbutrin in the past without help. -Used to smoke 2.5packs per day -Quit date: 8/24   PERTINENT  PMH / PSH: palpitations (sees Dr. Izora Ribas); cervicalgia (sees [facility])  OBJECTIVE:   BP 130/74   Pulse 75   Ht 5\' 10"  (1.778 m)   Wt 153 lb 9.6 oz (69.7 kg)   SpO2 100%   BMI 22.04 kg/m    Gen: NAD CV: RRR, no MRG Pulm: CTAB, normal WOB on RA, no wheezes/rales/rhonchi  Skin:  1cm vascularized cystic lesion on L forehead 1-2cm nonvascularized cystic, mobile lesion distal to sternum  ASSESSMENT/PLAN:   Elevated blood pressure reading Asymptomatic but concerned given hx of smoking. 170/85, repeat 130/74 -Start amlodipine 5mg  qhs -f/u 40mo -encouraged to check blood pressure regularly at home and keep log, pt will buy cuff -Lipid panel, A1c today -Check BMP at next visit  Encounter for smoking cessation counseling 0.25-0.5 packs per week, has been trying his best to quit  -Start Chantix today -Quit date: 8/24  Sebaceous cyst 1-2cm cysts on L forehead and midline chest. Forehead cyst has some overlying vasculature.  -Referral to dermatology for evaluation and possible removal     3mo, MD [facility]

## 2022-08-14 NOTE — Assessment & Plan Note (Signed)
1-2cm cysts on L forehead and midline chest. Forehead cyst has some overlying vasculature.  -Referral to dermatology for evaluation and possible removal

## 2022-08-15 DIAGNOSIS — E785 Hyperlipidemia, unspecified: Secondary | ICD-10-CM | POA: Insufficient documentation

## 2022-08-15 DIAGNOSIS — E1169 Type 2 diabetes mellitus with other specified complication: Secondary | ICD-10-CM | POA: Insufficient documentation

## 2022-08-15 LAB — LIPID PANEL
Chol/HDL Ratio: 4.8 ratio (ref 0.0–5.0)
Cholesterol, Total: 238 mg/dL — ABNORMAL HIGH (ref 100–199)
HDL: 50 mg/dL (ref >39)
LDL Chol Calc (NIH): 152 mg/dL — ABNORMAL HIGH (ref 0–99)
Triglycerides: 197 mg/dL — ABNORMAL HIGH (ref 0–149)
VLDL Cholesterol Cal: 36 mg/dL (ref 5–40)

## 2022-08-15 LAB — HEMOGLOBIN A1C
Est. average glucose Bld gHb Est-mCnc: 100 mg/dL
Hgb A1c MFr Bld: 5.1 % (ref 4.8–5.6)

## 2022-08-15 MED ORDER — ROSUVASTATIN CALCIUM 20 MG PO TABS: 20.0000 mg | ORAL_TABLET | Freq: Every day | ORAL | 3 refills | Status: DC

## 2022-08-15 NOTE — Assessment & Plan Note (Signed)
Elevated total cholesterol, triglycerides, and LDL -start Crestor 20mg  qd

## 2022-08-15 NOTE — Addendum Note (Signed)
Addended byBarb Merino, Chisa Kushner on: 08/15/2022 02:14 PM   Modules accepted: Orders

## 2022-08-18 ENCOUNTER — Encounter: Payer: Self-pay | Admitting: Physical Medicine & Rehabilitation

## 2022-08-22 ENCOUNTER — Encounter: Payer: Self-pay | Admitting: Family Medicine

## 2022-08-22 ENCOUNTER — Telehealth: Payer: Self-pay | Admitting: Family Medicine

## 2022-08-22 NOTE — Telephone Encounter (Signed)
Called patient to check in about quitting smoking. He reports doing well on the Chantix (aside from some initial GI upset) and he has not had a cigarette in 3 days! This is the longest he has gone without smoking in a while. He is comfortable continuing the Chantix. Also plans to pick up his Crestor from the pharmacy.   Requests dermatology referral to an office close by rather than in [place], will get in touch with our referral coordinator and see what we can do.

## 2022-09-10 NOTE — Progress Notes (Deleted)
    SUBJECTIVE:   CHIEF COMPLAINT / HPI:   Hypertension follow-up -Patient was started on amlodipine 5 mg daily, reports good adherence*** -Home BP***  Smoking cessation follow-up -Started on Chantix at last visit with a quit date of 08/24 -***  Hyperlipidemia follow-up -Started on Crestor 20 mg daily  PERTINENT  PMH / PSH: ***  OBJECTIVE:   There were no vitals taken for this visit.  ***  ASSESSMENT/PLAN:   No problem-specific Assessment & Plan notes found for this encounter.     Vonna Drafts, MD [facility]

## 2022-09-12 ENCOUNTER — Ambulatory Visit: Payer: Medicaid Other | Admitting: Family Medicine

## 2022-09-18 ENCOUNTER — Ambulatory Visit: Payer: Medicaid Other | Admitting: Physical Medicine & Rehabilitation

## 2022-09-19 ENCOUNTER — Encounter: Payer: Medicaid Other | Admitting: Physical Medicine & Rehabilitation

## 2022-10-08 ENCOUNTER — Other Ambulatory Visit: Payer: Self-pay

## 2022-10-08 ENCOUNTER — Emergency Department (HOSPITAL_COMMUNITY): Payer: Medicaid Other

## 2022-10-08 ENCOUNTER — Encounter: Payer: Self-pay | Admitting: Internal Medicine

## 2022-10-08 ENCOUNTER — Emergency Department (HOSPITAL_COMMUNITY)
Admission: EM | Admit: 2022-10-08 | Discharge: 2022-10-09 | Disposition: A | Discharge status: Home / Self Care | Payer: Medicaid Other | Attending: Student | Admitting: Student

## 2022-10-08 DIAGNOSIS — R072 Precordial pain: Secondary | ICD-10-CM

## 2022-10-08 DIAGNOSIS — R0602 Shortness of breath: Secondary | ICD-10-CM | POA: Diagnosis not present

## 2022-10-08 DIAGNOSIS — Z5321 Procedure and treatment not carried out due to patient leaving prior to being seen by health care provider: Secondary | ICD-10-CM | POA: Diagnosis not present

## 2022-10-08 DIAGNOSIS — R200 Anesthesia of skin: Secondary | ICD-10-CM | POA: Diagnosis present

## 2022-10-08 LAB — CBC WITH DIFFERENTIAL/PLATELET
Abs Immature Granulocytes: 0.02 10*3/uL (ref 0.00–0.07)
Basophils Absolute: 0.1 10*3/uL (ref 0.0–0.1)
Basophils Relative: 1 %
Eosinophils Absolute: 0.3 10*3/uL (ref 0.0–0.5)
Eosinophils Relative: 4 %
HCT: 39.6 % (ref 39.0–52.0)
Hemoglobin: 13.7 g/dL (ref 13.0–17.0)
Immature Granulocytes: 0 %
Lymphocytes Relative: 30 %
Lymphs Abs: 2.1 10*3/uL (ref 0.7–4.0)
MCH: 31.6 pg (ref 26.0–34.0)
MCHC: 34.6 g/dL (ref 30.0–36.0)
MCV: 91.2 fL (ref 80.0–100.0)
Monocytes Absolute: 0.8 10*3/uL (ref 0.1–1.0)
Monocytes Relative: 11 %
Neutro Abs: 3.8 10*3/uL (ref 1.7–7.7)
Neutrophils Relative %: 54 %
Platelets: 307 10*3/uL (ref 150–400)
RBC: 4.34 MIL/uL (ref 4.22–5.81)
RDW: 12 % (ref 11.5–15.5)
WBC: 7 10*3/uL (ref 4.0–10.5)
nRBC: 0 % (ref 0.0–0.2)

## 2022-10-08 LAB — COMPREHENSIVE METABOLIC PANEL
ALT: 15 U/L (ref 0–44)
AST: 19 U/L (ref 15–41)
Albumin: 3.9 g/dL (ref 3.5–5.0)
Alkaline Phosphatase: 72 U/L (ref 38–126)
Anion gap: 8 (ref 5–15)
BUN: 10 mg/dL (ref 6–20)
CO2: 26 mmol/L (ref 22–32)
Calcium: 9.4 mg/dL (ref 8.9–10.3)
Chloride: 105 mmol/L (ref 98–111)
Creatinine, Ser: 1.03 mg/dL (ref 0.61–1.24)
GFR, Estimated: >60 mL/min (ref >60)
Glucose, Bld: 91 mg/dL (ref 70–99)
Potassium: 4.2 mmol/L (ref 3.5–5.1)
Sodium: 139 mmol/L (ref 135–145)
Total Bilirubin: 0.5 mg/dL (ref 0.3–1.2)
Total Protein: 6.9 g/dL (ref 6.5–8.1)

## 2022-10-08 LAB — TROPONIN I (HIGH SENSITIVITY): Troponin I (High Sensitivity): 4 ng/L (ref <18)

## 2022-10-08 LAB — MAGNESIUM: Magnesium: 2 mg/dL (ref 1.7–2.4)

## 2022-10-08 NOTE — ED Triage Notes (Signed)
Pt reported to ED with c/o numbness that radiates down left arm, states sensation has been present throughout the day. Also endorses dizziness and intermittent shortness of breath.

## 2022-10-08 NOTE — ED Provider Triage Note (Signed)
Emergency Medicine Provider Triage Evaluation Note  Kert Shackett Vargo , a 51 y.o. male  was evaluated in triage.  Pt complains of left arm numbness, shortness of breath.  Onset while he was at work this afternoon.  Patient with history of palpitations.  Patient denies any chest pain.  No fevers or chills.  He reports associated lightheadedness and dizziness..  Review of Systems  Positive: Lightheadedness, dizziness, shortness of breath, left arm paresthesia Negative: Chest pain  Physical Exam  BP 130/80 (BP Location: Right Arm)   Pulse 77   Temp 98 F (36.7 C)   Resp 18   SpO2 100%  Gen:   Awake, no distress   Resp:  Normal effort  MSK:   Moves extremities without difficulty  Other:  The patient is alert, attentive, and oriented x 3. Speech is clear. Cranial nerve II-VII grossly intact. Negative pronator drift. Sensation intact. Strength 5/5 in all extremities. Rapid alternating movement and fine finger movements intact.   Heart regular rate and rhythm.  Lungs clear to auscultation bilaterally.     Medical Decision Making  Medically screening exam initiated at 7:29 PM.  Appropriate orders placed.  Tiger Spieker Printy was informed that the remainder of the evaluation will be completed by another provider, this initial triage assessment does not replace that evaluation, and the importance of remaining in the ED until their evaluation is complete.  Patient presents for lightheadedness, dizziness, shortness of breath, left arm paresthesias.  Recommend by cardiologist to come to the ER.  Cardiac work-up pending at this time.   Doristine Devoid, PA-C 10/08/22 1931

## 2022-10-09 NOTE — ED Notes (Signed)
NA x3 for vitals 

## 2022-10-17 ENCOUNTER — Ambulatory Visit: Payer: Medicaid Other | Attending: Internal Medicine | Admitting: Internal Medicine

## 2022-10-17 ENCOUNTER — Encounter: Payer: Self-pay | Admitting: Internal Medicine

## 2022-10-17 VITALS — BP 128/82 | HR 64 | Ht 70.0 in | Wt 157.0 lb

## 2022-10-17 DIAGNOSIS — Z720 Tobacco use: Secondary | ICD-10-CM | POA: Diagnosis not present

## 2022-10-17 DIAGNOSIS — I491 Atrial premature depolarization: Secondary | ICD-10-CM | POA: Diagnosis not present

## 2022-10-17 DIAGNOSIS — R079 Chest pain, unspecified: Secondary | ICD-10-CM | POA: Insufficient documentation

## 2022-10-17 DIAGNOSIS — I471 Supraventricular tachycardia, unspecified: Secondary | ICD-10-CM | POA: Diagnosis not present

## 2022-10-17 MED ORDER — NITROGLYCERIN 0.4 MG SL SUBL: 0.4000 mg | SUBLINGUAL_TABLET | SUBLINGUAL | 3 refills | Status: DC | PRN

## 2022-10-17 MED ORDER — METOPROLOL TARTRATE 50 MG PO TABS: ORAL_TABLET | ORAL | 0 refills | Status: DC

## 2022-10-17 NOTE — Patient Instructions (Signed)
Medication Instructions:  Your physician has recommended you make the following change in your medication:  START: Nitroglycerin 0.4 mg by mouth under your tongue as needed for chest pain  If you have chest pain place 1 tablet under your tongue, wait 5 min If chest pain continues place a 2nd tablet under your tongue, wait 5 min If chest pain continues place a 3rd tablet under your tongue, wait 5 min If chest pain continues call 911  *If you need a refill on your cardiac medications before your next appointment, please call your pharmacy*   Lab Work: BMP If you have labs (blood work) drawn today and your tests are completely normal, you will receive your results only by: [place] (if you have MyChart) OR A paper copy in the mail If you have any lab test that is abnormal or we need to change your treatment, we will call you to review the results.   Testing/Procedures: Your physician has requested that you have cardiac CT. Cardiac computed tomography (CT) is a painless test that uses an x-ray machine to take clear, detailed pictures of your heart. For further information please visit [email or web address]. Please follow instruction sheet as given.     Follow-Up: At [facility], you and your health needs are our priority.  As part of our continuing mission to provide you with exceptional heart care, we have created designated Provider Care Teams.  These Care Teams include your primary Cardiologist (physician) and Advanced Practice Providers (APPs -  Physician Assistants and Nurse Practitioners) who all work together to provide you with the care you need, when you need it.   Your next appointment:   3 month(s)  The format for your next appointment:   In Person  Provider:     Melina Copa, PA-C, Ambrose Pancoast, NP, or Ermalinda Barrios, PA-C       Other Instructions  Your cardiac CT will be scheduled at one of the below locations:   [facility] [address] [place], [place] [address] [phone]  [place], [place] [address] [phone]  [facility] [address], [place] [address] [phone]  If scheduled at [facility] please arrive at the [facility] and Children's Entrance (Entrance C2) of [facility] 30 minutes prior to test start time. You can use the FREE valet parking offered at entrance C (encouraged to control the heart rate for the test)  Proceed to the [facility] Radiology Department (first floor) to check-in and test prep.  All radiology patients and guests should use entrance C2 at [facility], accessed from [facility], even though the hospital's physical address listed is [address].    If scheduled at [facility] or [facility], please arrive 15 mins early for check-in and test prep.   Please follow these instructions carefully (unless otherwise directed):  Hold all erectile dysfunction medications at least 3 days (72 hrs) prior to test. (Ie viagra, cialis, sildenafil, tadalafil, etc) We will administer nitroglycerin during this exam.   On the Night Before the Test: Be sure to Drink plenty of water. Do not consume any caffeinated/decaffeinated beverages or chocolate 12 hours prior to your test. Do not take any antihistamines 12 hours prior to your test.   On the Day of the Test: Drink plenty of water until 1 hour prior  to the test. Do not eat any food 1 hour prior to test. You may take your regular medications prior to the test.  Take metoprolol (Lopressor) 50 mg by mouth two hours prior to test. HOLD Furosemide/Hydrochlorothiazide morning of the test.        After the Test: Drink plenty of water. After receiving IV contrast, you may experience a mild flushed feeling. This is normal. On  occasion, you may experience a mild rash up to 24 hours after the test. This is not dangerous. If this occurs, you can take Benadryl 25 mg and increase your fluid intake. If you experience trouble breathing, this can be serious. If it is severe call 911 IMMEDIATELY. If it is mild, please call our office. If you take any of these medications: Glipizide/Metformin, Avandament, Glucavance, please do not take 48 hours after completing test unless otherwise instructed.  We will call to schedule your test 2-4 weeks out understanding that some insurance companies will need an authorization prior to the service being performed.   For non-scheduling related questions, please contact the cardiac imaging nurse navigator should you have any questions/concerns: Marchia Bond, Cardiac Imaging Nurse Navigator Gordy Clement, Cardiac Imaging Nurse Navigator [facility] Direct Office Dial: [phone]   For scheduling needs, including cancellations and rescheduling, please call [place], [phone].   Important Information About Sugar

## 2022-10-17 NOTE — Progress Notes (Signed)
.  Cardiology Office Note:    Date:  10/17/2022   ID:  Rick [name], DOB November 03, 1971, MRN [id number]  PCP:  August Albino, MD  [facility] Cardiologist:  Werner Lean, MD  [place] Electrophysiologist:  None   CC: Follow up palpitations on BB; smoking cessation  History of Present Illness:    Rick [name] is a 51 y.o. male with a hx of depression, tobacco abuse (stopped  with occasional relaplse)  hx of likely SVT that has abated with vagal maneuvers in the past who presented for evaluation 10/26/20.  In interval, had ZioPatch notable for occasional symptomatic PACs.  2022: In interim of this visit, patient was started on low dose metoprolol. 2023: ED visit   Patient notes that he is doing .   Since last visit notes that he needs normal EKG.  He has arm pain that radiates done his left arm.  No SOB/DOE and no PND/Orthopnea.  No weight gain or leg swelling.  No palpitations or syncope.  Past Medical History:  Diagnosis Date   Back pain    BOILS, RECURRENT 04/13/2007   Qualifier: Diagnosis of  By: Erin Hearing MD, [place]     Depression    GASTROESOPHAGEAL REFLUX, NO ESOPHAGITIS 02/25/2007   Qualifier: Diagnosis of  By: Drucie Ip     MIGRAINE, UNSPEC., W/O INTRACTABLE MIGRAINE 02/25/2007   Qualifier: Diagnosis of  By: Drucie Ip     Neck pain    OSTEOARTHRITIS OF SPINE, NOS 02/25/2007   Qualifier: Diagnosis of  By: Drucie Ip     PAC (premature atrial contraction) 01/28/2021   Palpitations 10/02/2020    Past Surgical History:  Procedure Laterality Date   LUMBAR LAMINECTOMY/DECOMPRESSION MICRODISCECTOMY Right 01/03/2022   Procedure: Microdiscectomy - L2-L3 - right;  Surgeon: Kary Kos, MD;  Location: [place];  Service: Neurosurgery;  Laterality: Right;  3C   NECK SURGERY     x 3   TONSILLECTOMY Bilateral 1976    Current Medications: Current Meds  Medication Sig   amLODipine (NORVASC) 5 MG tablet Take 1 tablet (5 mg total) by mouth  at bedtime.   gabapentin (NEURONTIN) 600 MG tablet Take 600 mg by mouth 3 (three) times daily.   metoprolol tartrate (LOPRESSOR) 50 MG tablet Take 2 hours prior to Cardiac CT   nitroGLYCERIN (NITROSTAT) 0.4 MG SL tablet Place 1 tablet (0.4 mg total) under the tongue every 5 (five) minutes as needed for chest pain.   rosuvastatin (CRESTOR) 20 MG tablet Take 1 tablet (20 mg total) by mouth daily.   SUBOXONE 8-2 MG FILM Place under the tongue daily.   Varenicline Tartrate, Starter, (CHANTIX STARTING MONTH PAK) 0.5 MG X 11 & 1 MG X 42 TBPK Days 1 to 3: 0.5 mg once daily. Days 4 to 7: 0.5 mg twice daily. Then increase to 1 mg twice daily     Allergies:   Patient has no known allergies.   Social History   Socioeconomic History   Marital status: Married    Spouse name: Mardene Celeste   Number of children: 2   Years of education: Not on file   Highest education level: Not on file  Occupational History   Occupation: Disability  Tobacco Use   Smoking status: Every Day    Packs/day: 0.10    Years: 35.00    Total pack years: 3.50    Types: Cigarettes   Smokeless tobacco: Never  Vaping Use   Vaping Use: Never used  Substance and Sexual  Activity   Alcohol use: Not Currently   Drug use: No   Sexual activity: Yes  Other Topics Concern   Not on file  Social History Narrative   Not on file   Social Determinants of Health   Financial Resource Strain: Not on file  Food Insecurity: Not on file  Transportation Needs: Not on file  Physical Activity: Not on file  Stress: Not on file  Social Connections: Not on file     Family History: The patient's family history includes Cancer in his father, maternal grandfather, and mother.  History of coronary artery disease notable for grandfather. History of heart failure notable for no members. History of arrhythmia notable for no members. Denies family history of sudden cardiac death including drowning, car accidents, or unexplained deaths in the  family.  ROS:   Please see the history of present illness.    All other systems reviewed and are negative.  EKGs/Labs/Other Studies Reviewed:    The following studies were reviewed today:  EKG:   10/02/20 SR rate 70 no ST/T changes  Cardiac Event Monitoring: Date: 11/27/2020 Results: Patient had a minimum heart rate of 49 bpm, maximum heart rate of 165 bpm, and average heart rate of 77 bpm. Predominant underlying rhythm was sinus rhythm. Isolated PACs were rare (<1.0%), with rare couplets. Isolated PVCs were rare (<1.0%). No evidence of complete heart block. Triggered and diary events associated with sinus rhythm and PACs.   No malignant arrhythmias. Rare PACs that, occasionally, are symptomatic.   Recent Labs: 10/08/2022: ALT 15; BUN 10; Creatinine, Ser 1.03; Hemoglobin 13.7; Magnesium 2.0; Platelets 307; Potassium 4.2; Sodium 139   Physical Exam:    VS:  BP 128/82   Pulse 64   Ht 5\' 10"  (1.778 m)   Wt 157 lb (71.2 kg)   SpO2 98%   BMI 22.53 kg/m     Wt Readings from Last 3 Encounters:  10/17/22 157 lb (71.2 kg)  08/14/22 153 lb 9.6 oz (69.7 kg)  01/03/22 165 lb (74.8 kg)    GEN: Well nourished, well developed in no acute distress; smells of smoke HEENT: Normal NECK: No JVD LYMPHATICS: No lymphadenopathy CARDIAC: RRR, no murmurs, rubs, gallops RESPIRATORY:  Clear to auscultation without rales, wheezing or rhonchi  ABDOMEN: Soft, non-tender, non-distended MUSCULOSKELETAL:  No edema; No deformity  SKIN: Warm and dry NEUROLOGIC:  Alert and oriented x 3 PSYCHIATRIC:  Normal affect   ASSESSMENT:    1. Chest pain of uncertain etiology   2. Tobacco abuse   3. PAC (premature atrial contraction)   4. Paroxysmal SVT (supraventricular tachycardia)     PLAN:    Chest pain syndrome Tobacco abuse C -spine - Risk Factors for CAD - Will get Cardiac CT - will get PRN nitro - continue new statin, labs in two months  HTN - amlodipine 5 mg  Paroxysmal SVT and  PACs - metoprolol 12.5 mg PO as only PRN  Three months with APP; if obstructive disease, Add ASA, Imdur 30; and bring back for cath discussion Discussed smoking cessation.    Medication Adjustments/Labs and Tests Ordered: Current medicines are reviewed at length with the patient today.  Concerns regarding medicines are outlined above.  Orders Placed This Encounter  Procedures   Basic metabolic panel    Meds ordered this encounter  Medications   nitroGLYCERIN (NITROSTAT) 0.4 MG SL tablet    Sig: Place 1 tablet (0.4 mg total) under the tongue every 5 (five) minutes as needed for chest  pain.    Dispense:  25 tablet    Refill:  3   metoprolol tartrate (LOPRESSOR) 50 MG tablet    Sig: Take 2 hours prior to Cardiac CT    Dispense:  1 tablet    Refill:  0     Patient Instructions  Medication Instructions:  Your physician has recommended you make the following change in your medication:  START: Nitroglycerin 0.4 mg by mouth under your tongue as needed for chest pain  If you have chest pain place 1 tablet under your tongue, wait 5 min If chest pain continues place a 2nd tablet under your tongue, wait 5 min If chest pain continues place a 3rd tablet under your tongue, wait 5 min If chest pain continues call 911  *If you need a refill on your cardiac medications before your next appointment, please call your pharmacy*   Lab Work: BMP If you have labs (blood work) drawn today and your tests are completely normal, you will receive your results only by: MyChart Message (if you have MyChart) OR A paper copy in the mail If you have any lab test that is abnormal or we need to change your treatment, we will call you to review the results.   Testing/Procedures: Your physician has requested that you have cardiac CT. Cardiac computed tomography (CT) is a painless test that uses an x-ray machine to take clear, detailed pictures of your heart. For further information please visit  [email or web address]. Please follow instruction sheet as given.     Follow-Up: At [facility], you and your health needs are our priority.  As part of our continuing mission to provide you with exceptional heart care, we have created designated Provider Care Teams.  These Care Teams include your primary Cardiologist (physician) and Advanced Practice Providers (APPs -  Physician Assistants and Nurse Practitioners) who all work together to provide you with the care you need, when you need it.   Your next appointment:   3 month(s)  The format for your next appointment:   In Person  Provider:     Ronie Spiesayna Dunn, PA-C, Robin SearingErnest Dick, NP, or Jacolyn ReedyMichele Lenze, PA-C       Other Instructions  Your cardiac CT will be scheduled at one of the below locations:   [facility] [address] [place], [place] [address] [phone]  OR  [facility] [address] [place], [place] [address] [phone]  OR   [facility] [address] [place], [place] [address] [phone]  If scheduled at [facility], please arrive at the [facility] (Entrance C2) of [facility] 30 minutes prior to test start time. You can use the FREE valet parking offered at entrance C (encouraged to control the heart rate for the test)  Proceed to the [facility] Radiology Department (first floor) to check-in and test prep.  All radiology patients and guests should use entrance C2 at [facility], accessed from [facility], even though the hospital's physical address listed is [address].    If scheduled at [facility] or [facility], please arrive 15 mins early for check-in and test prep.   Please follow these instructions carefully (unless otherwise directed):  Hold all erectile dysfunction medications at least 3  days (72 hrs) prior to test. (Ie viagra, cialis, sildenafil, tadalafil, etc) We will administer nitroglycerin during this exam.  On the Night Before the Test: Be sure to Drink plenty of water. Do not consume any caffeinated/decaffeinated beverages or chocolate 12 hours prior to your test. Do not take any antihistamines 12 hours prior to your test.   On the Day of the Test: Drink plenty of water until 1 hour prior to the test. Do not eat any food 1 hour prior to test. You may take your regular medications prior to the test.  Take metoprolol (Lopressor) 50 mg by mouth two hours prior to test. HOLD Furosemide/Hydrochlorothiazide morning of the test.        After the Test: Drink plenty of water. After receiving IV contrast, you may experience a mild flushed feeling. This is normal. On occasion, you may experience a mild rash up to 24 hours after the test. This is not dangerous. If this occurs, you can take Benadryl 25 mg and increase your fluid intake. If you experience trouble breathing, this can be serious. If it is severe call 911 IMMEDIATELY. If it is mild, please call our office. If you take any of these medications: Glipizide/Metformin, Avandament, Glucavance, please do not take 48 hours after completing test unless otherwise instructed.  We will call to schedule your test 2-4 weeks out understanding that some insurance companies will need an authorization prior to the service being performed.   For non-scheduling related questions, please contact the cardiac imaging nurse navigator should you have any questions/concerns: Marchia Bond, Cardiac Imaging Nurse Navigator Gordy Clement, Cardiac Imaging Nurse Navigator [facility] Direct Office Dial: [phone]   For scheduling needs, including cancellations and rescheduling, please call [place], [phone].   Important Information About Sugar         Signed, Werner Lean, MD   10/17/2022 12:56 PM    [facility]

## 2022-10-18 LAB — BASIC METABOLIC PANEL
BUN/Creatinine Ratio: 8 — ABNORMAL LOW (ref 9–20)
BUN: 8 mg/dL (ref 6–24)
CO2: 27 mmol/L (ref 20–29)
Calcium: 9.4 mg/dL (ref 8.7–10.2)
Chloride: 99 mmol/L (ref 96–106)
Creatinine, Ser: 1.02 mg/dL (ref 0.76–1.27)
Glucose: 86 mg/dL (ref 70–99)
Potassium: 4.7 mmol/L (ref 3.5–5.2)
Sodium: 138 mmol/L (ref 134–144)
eGFR: 88 mL/min/{1.73_m2} (ref >59)

## 2022-10-22 NOTE — Telephone Encounter (Signed)
Per last ov note 10/17/22:  ASSESSMENT:     1. Chest pain of uncertain etiology   2. Tobacco abuse   3. PAC (premature atrial contraction)   4. Paroxysmal SVT (supraventricular tachycardia)       PLAN:     Chest pain syndrome Tobacco abuse C -spine - Risk Factors for CAD - Will get Cardiac CT - will get PRN nitro - continue new statin, labs in two months _________________________________________________________________________________  The patient was provided instructions for CT as well as beta blocker prescription.  I placed order for cardiac CT and replied to his message.  Will send message to CT scheduling to arrange.

## 2022-10-24 ENCOUNTER — Ambulatory Visit: Payer: Medicaid Other | Admitting: Family Medicine

## 2022-10-31 ENCOUNTER — Ambulatory Visit: Payer: Medicaid Other | Admitting: Family Medicine

## 2022-11-06 ENCOUNTER — Ambulatory Visit: Payer: Medicaid Other | Admitting: Physical Medicine & Rehabilitation

## 2022-11-10 ENCOUNTER — Ambulatory Visit: Payer: Medicaid Other | Admitting: Family Medicine

## 2022-11-12 ENCOUNTER — Ambulatory Visit: Payer: Medicaid Other | Admitting: Family Medicine

## 2022-11-17 ENCOUNTER — Ambulatory Visit: Payer: Medicaid Other | Admitting: Family Medicine

## 2022-11-25 ENCOUNTER — Emergency Department (HOSPITAL_COMMUNITY)
Admission: EM | Admit: 2022-11-25 | Discharge: 2022-11-26 | Discharge status: Against Medical Advice | Payer: Medicaid Other | Source: Home / Self Care

## 2022-11-25 ENCOUNTER — Emergency Department (HOSPITAL_COMMUNITY): Payer: Medicaid Other

## 2022-11-25 ENCOUNTER — Emergency Department (HOSPITAL_COMMUNITY)
Admission: EM | Admit: 2022-11-25 | Discharge: 2022-11-25 | Disposition: A | Discharge status: Home / Self Care | Payer: Medicaid Other | Attending: Emergency Medicine | Admitting: Emergency Medicine

## 2022-11-25 ENCOUNTER — Encounter (HOSPITAL_COMMUNITY): Payer: Self-pay

## 2022-11-25 ENCOUNTER — Other Ambulatory Visit: Payer: Self-pay

## 2022-11-25 DIAGNOSIS — M25561 Pain in right knee: Secondary | ICD-10-CM | POA: Insufficient documentation

## 2022-11-25 DIAGNOSIS — X509XXA Other and unspecified overexertion or strenuous movements or postures, initial encounter: Secondary | ICD-10-CM | POA: Insufficient documentation

## 2022-11-25 DIAGNOSIS — Z5321 Procedure and treatment not carried out due to patient leaving prior to being seen by health care provider: Secondary | ICD-10-CM | POA: Insufficient documentation

## 2022-11-25 MED ORDER — HYDROCODONE-ACETAMINOPHEN 5-325 MG PO TABS
2.0000 | ORAL_TABLET | Freq: Once | ORAL | Status: AC
  Administered 2022-11-25: 2 via ORAL
  Filled 2022-11-25: qty 2

## 2022-11-25 MED ORDER — MELOXICAM 15 MG PO TABS: 15.0000 mg | ORAL_TABLET | Freq: Every day | ORAL | 0 refills | Status: DC

## 2022-11-25 NOTE — ED Triage Notes (Signed)
Pt arrives POV for eval of R sided knee pain. Pt reports he heard a pop when he stood up off of the ground. Reports hx of joint issues with this knee, but this feels different. Reports unable to ambulate after injury. Strong DP pulse in triage, able to wiggle toes. In obvious pain. Pants cut in triage.

## 2022-11-25 NOTE — Discharge Instructions (Signed)
You were seen today for right-sided knee pain.  Your CT imaging shows what is likely a meniscal tear.  Please follow-up with orthopedics as you may need an outpatient MRI and further evaluation and management.  You were provided with a knee immobilizer and crutches today.  Please utilize these to reduce weightbearing on the right lower extremity.  You may apply ice to the affected area.  I have prescribed meloxicam which is an anti-inflammatory.  Do not take other NSAID medications while taking this medication.  Follow-up with orthopedics.

## 2022-11-25 NOTE — ED Provider Triage Note (Signed)
Emergency Medicine Provider Triage Evaluation Note  Rick [name] , a 51 y.o. male  was evaluated in triage.  Pt complains of right knee pain.  He was just seen earlier today for same and diagnosed with meniscal tear.  Denies new injury, just states he is having increased pain.  Has not taken any meds since discharge.  Review of Systems  Positive: Knee pain Negative: fever  Physical Exam  There were no vitals taken for this visit.  Gen:   Awake, no distress   Resp:  Normal effort  MSK:   Moves extremities without difficulty  Other:    Medical Decision Making  Medically screening exam initiated at 11:27 PM.  Appropriate orders placed.  Rick [name] was informed that the remainder of the evaluation will be completed by another provider, this initial triage assessment does not replace that evaluation, and the importance of remaining in the ED until their evaluation is complete.  Right knee pain.  Just discharged from here or same.  No new injury/trauma.

## 2022-11-25 NOTE — ED Provider Notes (Signed)
[facility] EMERGENCY DEPARTMENT Provider Note   CSN: [id number] Arrival date & time: 11/25/22  1112     History  Chief Complaint  Patient presents with   Knee Pain    Rick [name] is a 51 y.o. male.  Patient presents the emergency department for evaluation of right-sided knee pain.  Patient reports that he stood up this morning and heard a pop in his right knee.  He states that he has a history of joint issues with this knee but this feels different.  He states he was unable to ambulate after feeling the pop.  He states the knee felt like it was stuck in flexion and when he straightened it he had severe pain.  He denies fevers, nausea, vomiting, chest pain, shortness of breath, lower extremity swelling  HPI     Home Medications Prior to Admission medications   Medication Sig Start Date End Date Taking? Authorizing Provider  meloxicam (MOBIC) 15 MG tablet Take 1 tablet (15 mg total) by mouth daily. 11/25/22  Yes Barrie Dunker B, PA-C  amLODipine (NORVASC) 5 MG tablet Take 1 tablet (5 mg total) by mouth at bedtime. 08/14/22   Vonna Drafts, MD  gabapentin (NEURONTIN) 600 MG tablet Take 600 mg by mouth 3 (three) times daily. 07/03/22   [provider]  metoprolol tartrate (LOPRESSOR) 50 MG tablet Take 2 hours prior to Cardiac CT 10/17/22   Riley Lam A, MD  nitroGLYCERIN (NITROSTAT) 0.4 MG SL tablet Place 1 tablet (0.4 mg total) under the tongue every 5 (five) minutes as needed for chest pain. 10/17/22   Chandrasekhar, Lafayette Dragon A, MD  rosuvastatin (CRESTOR) 20 MG tablet Take 1 tablet (20 mg total) by mouth daily. 08/15/22   Vonna Drafts, MD  SUBOXONE 8-2 MG FILM Place under the tongue daily. 10/06/22   [provider]  Varenicline Tartrate, Starter, (CHANTIX STARTING MONTH PAK) 0.5 MG X 11 & 1 MG X 42 TBPK Days 1 to 3: 0.5 mg once daily. Days 4 to 7: 0.5 mg twice daily. Then increase to 1 mg twice daily 08/14/22   Vonna Drafts, MD       Allergies    Patient has no known allergies.    Review of Systems   Review of Systems  Musculoskeletal:  Positive for arthralgias.    Physical Exam Updated Vital Signs BP (!) 152/112   Pulse 85   Temp 98.2 F (36.8 C)   Resp 16   Ht 5\' 10"  (1.778 m)   Wt 72 kg   SpO2 98%   BMI 22.78 kg/m  Physical Exam Vitals and nursing note reviewed.  Constitutional:      General: He is not in acute distress.    Appearance: He is well-developed.  HENT:     Head: Normocephalic and atraumatic.  Eyes:     Conjunctiva/sclera: Conjunctivae normal.  Cardiovascular:     Rate and Rhythm: Normal rate.  Pulmonary:     Effort: Pulmonary effort is normal.  Abdominal:     General: Abdomen is flat.  Musculoskeletal:        General: Swelling and tenderness present. No deformity.     Cervical back: Neck supple.     Comments: Tenderness and swelling noted to patient's right knee.  Patient with positive McMurray's test  Skin:    General: Skin is warm and dry.     Capillary Refill: Capillary refill takes less than 2 seconds.  Neurological:     Mental Status: He  is alert.  Psychiatric:        Mood and Affect: Mood normal.     ED Results / Procedures / Treatments   Labs (all labs ordered are listed, but only abnormal results are displayed) Labs Reviewed - No data to display  EKG None  Radiology CT Knee Right Wo Contrast  Result Date: 11/25/2022 CLINICAL DATA:  Knee pain, stress fracture suspected. Patient reports hearing a pop when standing up. EXAM: CT OF THE RIGHT KNEE WITHOUT CONTRAST TECHNIQUE: Multidetector CT imaging of the right knee was performed according to the standard protocol. Multiplanar CT image reconstructions were also generated. RADIATION DOSE REDUCTION: This exam was performed according to the departmental dose-optimization program which includes automated exposure control, adjustment of the mA and/or kV according to patient size and/or use of iterative reconstruction  technique. COMPARISON:  Radiographs 11/25/2022 FINDINGS: Bones/Joint/Cartilage The knee is partially flexed. No evidence of acute fracture, dislocation or stress fracture. The joint spaces are preserved. No significant knee joint effusion. Ligaments Suboptimally assessed by CT. The cruciate ligaments are grossly intact. Possible bucket-handle tear of the lateral meniscus with an anteriorly flipped meniscal fragment, not easily diagnosed by routine CT. The medial meniscus appears grossly normal. Muscles and Tendons The muscles and tendons about the knee appear unremarkable. Soft tissues No inflammatory changes, fluid collections or foreign bodies. IMPRESSION: 1. No evidence of acute fracture, dislocation or stress fracture. 2. Possible bucket-handle tear of the lateral meniscus with an anteriorly flipped meniscal fragment, not easily diagnosed by routine CT. Recommend nonemergent MRI for further evaluation. Electronically Signed   By: Carey Bullocks M.D.   On: 11/25/2022 15:34   DG Knee 1-2 Views Right  Result Date: 11/25/2022 CLINICAL DATA:  Pain EXAM: RIGHT KNEE - 2 VIEW COMPARISON:  None Available. FINDINGS: No evidence of fracture, dislocation, or joint effusion. No evidence of arthropathy or other focal bone abnormality. Soft tissues are unremarkable. IMPRESSION: Negative. Electronically Signed   By: Allegra Lai M.D.   On: 11/25/2022 14:00    Procedures .Ortho Injury Treatment  Date/Time: 11/25/2022 4:55 PM  Performed by: Darrick Grinder, PA-C Authorized by: Darrick Grinder, PA-C   Consent:    Consent obtained:  Verbal   Consent given by:  Patient   Risks discussed:  Restricted joint movement, vascular damage, stiffness and nerve damage   Alternatives discussed:  No treatmentInjury location: knee Location details: right knee Injury type: soft tissue Pre-procedure neurovascular assessment: neurovascularly intact Immobilization: brace Splint Applied by: ED Nurse Post-procedure  neurovascular assessment: post-procedure neurovascularly intact Comments: Knee immobilizer and crutches       Medications Ordered in ED Medications  HYDROcodone-acetaminophen (NORCO/VICODIN) 5-325 MG per tablet 2 tablet (has no administration in time range)    ED Course/ Medical Decision Making/ A&P                           Medical Decision Making  Patient presents to the emergency department with chief complaint of knee pain.  Differential diagnosis includes but is not limited to fracture, dislocation, soft tissue injury, ligamentous injury, and others  I reviewed the patient's past medical history and found no relevant orthopedic encounters for the past 10 years.  I ordered and interpreted imaging including plain films of the right knee and CT of the right knee without contrast.   Plain films were negative.  CT shows: 1. No evidence of acute fracture, dislocation or stress fracture.  2. Possible bucket-handle tear  of the lateral meniscus with an  anteriorly flipped meniscal fragment, not easily diagnosed by  routine CT. Recommend nonemergent MRI for further evaluation.  I agree with the radiologist findings  I ordered the patient hydrocodone for pain.  Upon reassessment the patient had improved.  The patient was placed in a knee immobilizer and given crutches.  Please see procedure note  The patient CT scan was concerning for likely lateral meniscal injury.  Recommended outpatient nonemergent MRI for further evaluation.  Plan to discharge patient home with crutches and immobilizer with recommendation for follow-up with orthopedics.  Patient prescribed anti-inflammatory medication.  Patient given recommendations for RICE therapy       Final Clinical Impression(s) / ED Diagnoses Final diagnoses:  Acute pain of right knee    Rx / DC Orders ED Discharge Orders          Ordered    meloxicam (MOBIC) 15 MG tablet  Daily        11/25/22 1719               Pamala Duffel 11/25/22 1720    Mardene Sayer, MD 11/25/22 2255

## 2022-11-25 NOTE — ED Provider Triage Note (Signed)
Emergency Medicine Provider Triage Evaluation Note  Rick [name] , a 51 y.o. male  was evaluated in triage.  Pt complains of R knee injury this am. Knee was locked in flexion then forcibly strainghtened, severe pain  Review of Systems  Positive: Knee pain  Negative: fever  Physical Exam  BP (!) 152/112   Pulse 85   Temp 98.2 F (36.8 C)   Resp 16   Ht 5\' 10"  (1.778 m)   Wt 72 kg   SpO2 98%   BMI 22.78 kg/m  Gen:   Awake, no distress   Resp:  Normal effort  MSK:   Moves extremities without difficulty  Other:  No swelling  Medical Decision Making  Medically screening exam initiated at 2:56 PM.  Appropriate orders placed.  Rick [name] was informed that the remainder of the evaluation will be completed by another provider, this initial triage assessment does not replace that evaluation, and the importance of remaining in the ED until their evaluation is complete.  Work up initiated   Maylon Peppers, PA-C 11/25/22 1458

## 2022-11-25 NOTE — ED Triage Notes (Signed)
Pt BIB EMS for right knee pain that he was seen for today. Pt diagnosed with mensicus tear and d/c'd. Per pt, the pain has gotten worse since he left. Pt did not pick up his pain meds from pharmacy.

## 2022-11-26 NOTE — ED Notes (Signed)
X2 no response °

## 2022-12-12 ENCOUNTER — Encounter: Payer: Medicaid Other | Admitting: Physical Medicine & Rehabilitation

## 2022-12-15 ENCOUNTER — Telehealth: Payer: Self-pay | Admitting: *Deleted

## 2022-12-15 NOTE — Telephone Encounter (Signed)
   Pre-operative Risk Assessment    Patient Name: Rick [name]  DOB: 01/27/1971 MRN: [id number]      Request for Surgical Clearance    Procedure:   RIGHT KNEE SCOPE PLM  Date of Surgery:  Clearance TBD                                 Surgeon:  DR. Benny Lennert Surgeon's Group or Practice Name:  [facility] Phone number:  [phone] ATTN: KERRI MAZE Fax number:  [phone]   Type of Clearance Requested:   - Medical ; NO MEDICATIONS LISTED AS NEEDING TO BE HELD   Type of Anesthesia:  General WITH KNEE BLOCK   Additional requests/questions:    Elpidio Anis   12/15/2022, 4:15 PM

## 2022-12-15 NOTE — Telephone Encounter (Signed)
I have sent a message to [place] to help schedule CT, see notes from [place], [facility].    Hi [place], can you help with this pt. Looks like Dr. Izora Ribas ordered a CT 09/2022, but pt has not had it done yet and now he needs to have to done for pre op clearance. Can you help get this pt scheduled for the CT,. If I have sent this message to the wrong person, please let me know!! Your help is much appreciated in this matter.

## 2022-12-15 NOTE — Telephone Encounter (Signed)
Pre-op covering team, patient was last seen by Dr. Izora Ribas on 10/17/2022 at which time he reported some left arm pain/ chest pain. A coronary CTA was ordered but it does not look like patient has had this done yet. He will need to have this done before we can say whether or not he is OK to proceed with surgery. Can we follow-up with patient and get coronary CTA scheduled.  Thank you!

## 2022-12-19 NOTE — Telephone Encounter (Signed)
I will update Robin Searing, NP for upcoming appt with the pt to please see all notes, including those about CT. I will also send FYI to requesting office.

## 2022-12-19 NOTE — Telephone Encounter (Signed)
Preoperative team patient has follow-up coronary CTA scheduled and has appointment scheduled with Robin Searing, NP-C after diagnostics.  Please add preoperative cardiac evaluation to appointment note.  I will remove him from the preoperative pool.  Thank you for your help.  Thomasene Ripple. Quaran Kedzierski NP-C     12/19/2022, 9:15 AM [facility] [address] Office [phone] Fax [phone]

## 2022-12-29 ENCOUNTER — Other Ambulatory Visit (HOSPITAL_COMMUNITY): Payer: Self-pay | Admitting: Student

## 2023-01-06 ENCOUNTER — Telehealth (HOSPITAL_COMMUNITY): Payer: Self-pay | Admitting: *Deleted

## 2023-01-06 NOTE — Telephone Encounter (Signed)
Attempted to call patient regarding upcoming cardiac CT appointment. °Left message on voicemail with name and callback number ° °Reginald Mangels RN Navigator Cardiac Imaging °[facility] °[phone] Office °[phone] Cell ° °

## 2023-01-07 ENCOUNTER — Ambulatory Visit (HOSPITAL_COMMUNITY)
Admission: RE | Admit: 2023-01-07 | Discharge: 2023-01-07 | Disposition: A | Discharge status: Home / Self Care | Payer: Medicaid Other | Source: Ambulatory Visit | Attending: Internal Medicine | Admitting: Internal Medicine

## 2023-01-07 DIAGNOSIS — R072 Precordial pain: Secondary | ICD-10-CM | POA: Diagnosis not present

## 2023-01-07 MED ORDER — NITROGLYCERIN 0.4 MG SL SUBL
SUBLINGUAL_TABLET | SUBLINGUAL | Status: AC
  Filled 2023-01-07: qty 2

## 2023-01-07 MED ORDER — IOHEXOL 350 MG/ML SOLN
95.0000 mL | Freq: Once | INTRAVENOUS | Status: AC | PRN
  Administered 2023-01-07: 95 mL via INTRAVENOUS

## 2023-01-07 MED ORDER — NITROGLYCERIN 0.4 MG SL SUBL
0.8000 mg | SUBLINGUAL_TABLET | Freq: Once | SUBLINGUAL | Status: AC
  Administered 2023-01-07: 0.8 mg via SUBLINGUAL

## 2023-01-08 NOTE — Progress Notes (Signed)
Office Visit    Patient Name: Rick [name] Date of Encounter: 01/08/2023  Primary Care Provider:  August Albino, MD Primary Cardiologist:  Werner Lean, MD Primary Electrophysiologist: None  Chief Complaint    Rick [name] is a 52 y.o. male with PMH of nonobstructive CAD, SVT, tobacco abuse, depression, mild aortic dilation who presents today for surgical clearance.  Past Medical History    Past Medical History:  Diagnosis Date   Back pain    BOILS, RECURRENT 04/13/2007   Qualifier: Diagnosis of  By: Erin Hearing MD, [place]     Depression    GASTROESOPHAGEAL REFLUX, NO ESOPHAGITIS 02/25/2007   Qualifier: Diagnosis of  By: Drucie Ip     MIGRAINE, UNSPEC., W/O INTRACTABLE MIGRAINE 02/25/2007   Qualifier: Diagnosis of  By: Drucie Ip     Neck pain    OSTEOARTHRITIS OF SPINE, NOS 02/25/2007   Qualifier: Diagnosis of  By: Drucie Ip     PAC (premature atrial contraction) 01/28/2021   Palpitations 10/02/2020   Past Surgical History:  Procedure Laterality Date   LUMBAR LAMINECTOMY/DECOMPRESSION MICRODISCECTOMY Right 01/03/2022   Procedure: Microdiscectomy - L2-L3 - right;  Surgeon: Kary Kos, MD;  Location: [place];  Service: Neurosurgery;  Laterality: Right;  3C   NECK SURGERY     x 3   TONSILLECTOMY Bilateral 1976    Allergies  No Known Allergies  History of Present Illness    Rick [name]  is a 52 year old male with the above mention past medical history who presents today for preoperative clearance for upcoming knee surgery.  Rick [name] was initially seen by Dr. Allyson Sabal on 09/2020 for complaint of palpitations.  Zio patch was ordered and noted occasional symptomatic PACs.  Palpitations usually terminate with vagal maneuver.  He was started on beta-blocker however developed diarrhea and sluggishness and was discontinued.  He was last seen on 10/17/2022 for follow-up and complained of arm pain and chest pain.  Coronary CTA was  completed and showed coronary calcium score of 120 which is 25 percentile for age, sex and race.  There was less than 25% minimal plaque in left main, left circumflex and RCA.  There was moderate aortic root dilation of 43 mm noted.  Plan for CTA of aorta in 1 year for surveillance.  Rick [name] presents today for preoperative clearance and to review cardiac CT results.  Since last being seen in the office patient reports that he has been doing well with no recurrent chest discomfort or arm pain.  His blood pressure today was elevated 142/70.  He reports full compliance with his current medication regimen and denies any adverse reactions.  During our visit we reviewed the findings of his recent cardiac CT and discussed his mild aortic dilation.  We reviewed primary prevention measures for preventing progression of dilation such as smoking cessation, statin therapy, and reducing heavy lifting.  We discussed the importance of blood pressure control and the need to titrate his current therapies further to achieve goal of less than 130/80.  Patient denies chest pain, palpitations, dyspnea, PND, orthopnea, nausea, vomiting, dizziness, syncope, edema, weight gain, or early satiety.   Home Medications    Current Outpatient Medications  Medication Sig Dispense Refill   amLODipine (NORVASC) 5 MG tablet Take 1 tablet (5 mg total) by mouth at bedtime. 90 tablet 3   gabapentin (NEURONTIN) 600 MG tablet Take 600 mg by mouth 3 (three) times daily.     meloxicam (MOBIC)  15 MG tablet Take 1 tablet (15 mg total) by mouth daily. 30 tablet 0   metoprolol tartrate (LOPRESSOR) 50 MG tablet Take 2 hours prior to Cardiac CT 1 tablet 0   nitroGLYCERIN (NITROSTAT) 0.4 MG SL tablet Place 1 tablet (0.4 mg total) under the tongue every 5 (five) minutes as needed for chest pain. 25 tablet 3   rosuvastatin (CRESTOR) 20 MG tablet Take 1 tablet (20 mg total) by mouth daily. 90 tablet 3   SUBOXONE 8-2 MG FILM Place under the tongue  daily.     Varenicline Tartrate, Starter, (CHANTIX STARTING MONTH PAK) 0.5 MG X 11 & 1 MG X 42 TBPK Days 1 to 3: 0.5 mg once daily. Days 4 to 7: 0.5 mg twice daily. Then increase to 1 mg twice daily 53 each 0   No current facility-administered medications for this visit.     Review of Systems  Please see the history of present illness.    (+) Right knee pain All other systems reviewed and are otherwise negative except as noted above.  Physical Exam    Wt Readings from Last 3 Encounters:  11/25/22 158 lb (71.7 kg)  11/25/22 158 lb 11.7 oz (72 kg)  10/17/22 157 lb (71.2 kg)   AS:NKNLZ were no vitals filed for this visit.,There is no height or weight on file to calculate BMI.  Constitutional:      Appearance: Healthy appearance. Not in distress.  Neck:     Vascular: JVD normal.  Pulmonary:     Effort: Pulmonary effort is normal.     Breath sounds: No wheezing. No rales. Diminished in the bases Cardiovascular:     Normal rate. Regular rhythm. Normal S1. Normal S2.      Murmurs: There is no murmur.  Edema:    Peripheral edema absent.  Abdominal:     Palpations: Abdomen is soft non tender. There is no hepatomegaly.  Skin:    General: Skin is warm and dry.  Neurological:     General: No focal deficit present.     Mental Status: Alert and oriented to person, place and time.     Cranial Nerves: Cranial nerves are intact.  EKG/LABS/Other Studies Reviewed    ECG personally reviewed by me today - Normal Sinus Rhythm with rate of 78 bpm and no acute changes   Lab Results  Component Value Date   WBC 7.0 10/08/2022   HGB 13.7 10/08/2022   HCT 39.6 10/08/2022   MCV 91.2 10/08/2022   PLT 307 10/08/2022   Lab Results  Component Value Date   CREATININE 1.02 10/17/2022   BUN 8 10/17/2022   NA 138 10/17/2022   K 4.7 10/17/2022   CL 99 10/17/2022   CO2 27 10/17/2022   Lab Results  Component Value Date   ALT 15 10/08/2022   AST 19 10/08/2022   ALKPHOS 72 10/08/2022    BILITOT 0.5 10/08/2022   Lab Results  Component Value Date   CHOL 238 (H) 08/14/2022   HDL 50 08/14/2022   LDLCALC 152 (H) 08/14/2022   TRIG 197 (H) 08/14/2022   CHOLHDL 4.8 08/14/2022    Lab Results  Component Value Date   HGBA1C 5.1 08/14/2022    Assessment & Plan    1.  Preoperative surgical clearance: -The patient affirms he has been doing well without any new cardiac symptoms. They are able to achieve 4 METS without cardiac limitations. Therefore, based on ACC/AHA guidelines, the patient would be at acceptable risk  for the planned procedure without further cardiovascular testing. The patient was advised that if he develops new symptoms prior to surgery to contact our office to arrange for a follow-up visit, and he verbalized understanding.  Mr. Rheaume's perioperative risk of a major cardiac event is 0.4% according to the Revised Cardiac Risk Index (RCRI).  Therefore, he is at low risk for perioperative complications.   His functional capacity is good at 6.05 METs according to the Duke Activity Status Index (DASI). Recommendations: According to ACC/AHA guidelines, no further cardiovascular testing needed.  The patient may proceed to surgery at acceptable risk.   Antiplatelet and/or Anticoagulation Recommendations: Not applicable  2.Nonobstructive CAD: -Cardiac CTA completed with calcium score of 120 noted with minimal plaque in LAD, left circumflex, and RCA -Today patient reports no recurrence of chest pain or left arm pain. -Continue current GDMT with Crestor 20 mg daily and as needed nitroglycerin  3.  Aortic root dilation: -Moderate aortic root dilation of 43 mm -Smoking cessation advised and repeat CT of the aorta and chest in 1 year -Continue blood pressure control and GDMT with statin therapy.  4.  Tobacco use: -He is working on quitting and is now down to 5 cigarettes a month from 2 packs/day.  5.  Essential hypertension: -Patient's blood pressure today was elevated  at 142/70 -We will increase amlodipine to 10 mg daily -Patient was advised to continue to record blood pressures over the next 2 weeks and report back to clinic.  Disposition: Follow-up with Christell Constant, MD or APP in 2 months  Medication Adjustments/Labs and Tests Ordered: Current medicines are reviewed at length with the patient today.  Concerns regarding medicines are outlined above.   Signed, Napoleon Form, Leodis Rains, NP 01/08/2023, 10:42 AM [facility]  Note:  This document was prepared using Dragon voice recognition software and may include unintentional dictation errors.

## 2023-01-09 ENCOUNTER — Ambulatory Visit: Payer: Medicaid Other | Attending: Nurse Practitioner | Admitting: Nurse Practitioner

## 2023-01-09 ENCOUNTER — Encounter: Payer: Self-pay | Admitting: Nurse Practitioner

## 2023-01-09 VITALS — BP 142/70 | HR 78 | Ht 70.0 in | Wt 160.0 lb

## 2023-01-09 DIAGNOSIS — Z01810 Encounter for preprocedural cardiovascular examination: Secondary | ICD-10-CM | POA: Insufficient documentation

## 2023-01-09 DIAGNOSIS — R030 Elevated blood-pressure reading, without diagnosis of hypertension: Secondary | ICD-10-CM | POA: Diagnosis present

## 2023-01-09 DIAGNOSIS — I77810 Thoracic aortic ectasia: Secondary | ICD-10-CM | POA: Diagnosis present

## 2023-01-09 DIAGNOSIS — R002 Palpitations: Secondary | ICD-10-CM

## 2023-01-09 DIAGNOSIS — I10 Essential (primary) hypertension: Secondary | ICD-10-CM

## 2023-01-09 DIAGNOSIS — R079 Chest pain, unspecified: Secondary | ICD-10-CM | POA: Insufficient documentation

## 2023-01-09 DIAGNOSIS — Z720 Tobacco use: Secondary | ICD-10-CM | POA: Insufficient documentation

## 2023-01-09 MED ORDER — AMLODIPINE BESYLATE 10 MG PO TABS: 10.0000 mg | ORAL_TABLET | Freq: Every day | ORAL | 3 refills | Status: DC

## 2023-01-09 NOTE — Patient Instructions (Signed)
Medication Instructions:  INCREASE Norvasc to 10mg  Once a day  *If you need a refill on your cardiac medications before your next appointment, please call your pharmacy*  Lab Work: None Ordered  Testing/Procedures: None Ordered  Follow-Up: At [facility], you and your health needs are our priority.  As part of our continuing mission to provide you with exceptional heart care, we have created designated Provider Care Teams.  These Care Teams include your primary Cardiologist (physician) and Advanced Practice Providers (APPs -  Physician Assistants and Nurse Practitioners) who all work together to provide you with the care you need, when you need it.  We recommend signing up for the patient portal called "MyChart".  Sign up information is provided on this After Visit Summary.  MyChart is used to connect with patients for Virtual Visits (Telemedicine).  Patients are able to view lab/test results, encounter notes, upcoming appointments, etc.  Non-urgent messages can be sent to your provider as well.   To learn more about what you can do with MyChart, go to [email or web address].    Your next appointment:   2 month(s)  Provider:   Ambrose Pancoast, NP       Other Instructions

## 2023-01-12 NOTE — Addendum Note (Signed)
Addended byDanielle Dess on: 01/12/2023 09:36 AM   Modules accepted: Orders

## 2023-01-14 ENCOUNTER — Telehealth: Payer: Self-pay

## 2023-01-14 DIAGNOSIS — I77810 Thoracic aortic ectasia: Secondary | ICD-10-CM

## 2023-01-14 NOTE — Telephone Encounter (Signed)
-----  Message from Werner Lean, MD sent at 01/08/2023  7:58 AM EST ----- Results: Mild aortic dilation Minimal non obstructive CAD Plan: Gated CT Aorta in one year; Smoking cessation  Werner Lean, MD

## 2023-01-14 NOTE — Telephone Encounter (Signed)
Order placed for CT Aorta due around 12/31/23.  Put in comments testing to be completed GATED.

## 2023-01-16 ENCOUNTER — Ambulatory Visit: Payer: Medicaid Other | Admitting: Nurse Practitioner

## 2023-02-06 ENCOUNTER — Encounter: Payer: Medicaid Other | Admitting: Physical Medicine & Rehabilitation

## 2023-02-09 ENCOUNTER — Emergency Department (HOSPITAL_COMMUNITY): Payer: Medicaid Other

## 2023-02-09 ENCOUNTER — Emergency Department (HOSPITAL_COMMUNITY)
Admission: EM | Admit: 2023-02-09 | Discharge: 2023-02-09 | Discharge status: Against Medical Advice | Payer: Medicaid Other | Attending: Emergency Medicine | Admitting: Emergency Medicine

## 2023-02-09 ENCOUNTER — Encounter (HOSPITAL_COMMUNITY): Payer: Self-pay

## 2023-02-09 DIAGNOSIS — R0602 Shortness of breath: Secondary | ICD-10-CM | POA: Diagnosis not present

## 2023-02-09 DIAGNOSIS — R002 Palpitations: Secondary | ICD-10-CM | POA: Insufficient documentation

## 2023-02-09 DIAGNOSIS — Z79899 Other long term (current) drug therapy: Secondary | ICD-10-CM | POA: Diagnosis not present

## 2023-02-09 DIAGNOSIS — I10 Essential (primary) hypertension: Secondary | ICD-10-CM | POA: Insufficient documentation

## 2023-02-09 DIAGNOSIS — R000 Tachycardia, unspecified: Secondary | ICD-10-CM | POA: Insufficient documentation

## 2023-02-09 DIAGNOSIS — Z5329 Procedure and treatment not carried out because of patient's decision for other reasons: Secondary | ICD-10-CM | POA: Diagnosis not present

## 2023-02-09 DIAGNOSIS — R079 Chest pain, unspecified: Secondary | ICD-10-CM | POA: Diagnosis present

## 2023-02-09 LAB — CBC
HCT: 40.7 % (ref 39.0–52.0)
Hemoglobin: 14.8 g/dL (ref 13.0–17.0)
MCH: 32.3 pg (ref 26.0–34.0)
MCHC: 36.4 g/dL — ABNORMAL HIGH (ref 30.0–36.0)
MCV: 88.9 fL (ref 80.0–100.0)
Platelets: 298 10*3/uL (ref 150–400)
RBC: 4.58 MIL/uL (ref 4.22–5.81)
RDW: 12.3 % (ref 11.5–15.5)
WBC: 9.6 10*3/uL (ref 4.0–10.5)
nRBC: 0 % (ref 0.0–0.2)

## 2023-02-09 LAB — BASIC METABOLIC PANEL
Anion gap: 12 (ref 5–15)
BUN: 8 mg/dL (ref 6–20)
CO2: 21 mmol/L — ABNORMAL LOW (ref 22–32)
Calcium: 9.1 mg/dL (ref 8.9–10.3)
Chloride: 101 mmol/L (ref 98–111)
Creatinine, Ser: 1.02 mg/dL (ref 0.61–1.24)
GFR, Estimated: >60 mL/min (ref >60)
Glucose, Bld: 111 mg/dL — ABNORMAL HIGH (ref 70–99)
Potassium: 3.6 mmol/L (ref 3.5–5.1)
Sodium: 134 mmol/L — ABNORMAL LOW (ref 135–145)

## 2023-02-09 LAB — TROPONIN I (HIGH SENSITIVITY): Troponin I (High Sensitivity): 14 ng/L (ref <18)

## 2023-02-09 MED ORDER — SODIUM CHLORIDE 0.9 % IV BOLUS: 1000.0000 mL | Freq: Once | INTRAVENOUS | Status: DC

## 2023-02-09 NOTE — ED Provider Triage Note (Cosign Needed)
Emergency Medicine Provider Triage Evaluation Note  Rick [name] , a 52 y.o. male  was evaluated in triage.  Pt complains of substernal chest pain.  Began approximately 1 hour prior to arrival while he was working.  He took the prescribed nitro and states that it did not do anything.  He has some numbness and tingling in his left arm shortly after the pain began.  His symptoms completely resolved in the ambulance on the way here.  He states he has had 3 similar episodes in the past year, but his symptoms had resolved before the ambulance arrived so he did not come in.  Last saw his cardiologist 3 months ago.  Had associated diaphoresis at the time.  Denies all symptoms currently.  Review of Systems  Positive: As above Negative: As above  Physical Exam  BP (!) 162/84   Pulse (!) 102   Temp 98.2 F (36.8 C) (Oral)   Resp 16   Ht 5' 9"$  (1.753 m)   Wt 72.6 kg   SpO2 98%   BMI 23.63 kg/m  Gen:   Awake, no distress   Resp:  Normal effort  MSK:   Moves extremities without difficulty  Other:    Medical Decision Making  Medically screening exam initiated at 5:37 PM.  Appropriate orders placed.  Rick [name] was informed that the remainder of the evaluation will be completed by another provider, this initial triage assessment does not replace that evaluation, and the importance of remaining in the ED until their evaluation is complete.     Roylene Reason, [place] 02/09/23 1738

## 2023-02-09 NOTE — ED Triage Notes (Signed)
Pt bib ems from job site; c/o CP, sob that started this afternoon; 1 nitro pta with some relief; cardiac hx, last saw cardiologist 3 months ago; fluttering sensation, sinus tach 110-115; 146/82, 324 asa given pta, 18 ga rac; pt states intermittent dizziness upon standing 250 ns given pta, hr 95, ems reports lungs clear and equal, denies sob, 98% RA; endorses diaphoresis prior to ems arrival, warm and dry at present; pt endorses feeling better at present, palpitations resolved

## 2023-02-09 NOTE — Discharge Instructions (Signed)
You signed out against medical advice   Return to ER if you have worse chest pain or shortness of breath  See your cardiologist for follow-up

## 2023-02-09 NOTE — ED Provider Notes (Signed)
[facility] Provider Note   CSN: [id number] Arrival date & time: 02/09/23  1723     History  No chief complaint on file.   Rick [name] Mask is a 52 y.o. male history of hypertension, aortic dilation here presenting with chest pain and shortness of breath and palpitations.  Patient states that he was at work and has acute onset of chest pain and palpitations.  He states that he rested and usually his heart rate will go down but he states that he still having palpitations.  Patient states that he has knee surgery scheduled later this week and just had cardiac clearance by cardiology.  The history is provided by the patient.       Home Medications Prior to Admission medications   Medication Sig Start Date End Date Taking? Authorizing Provider  amLODipine (NORVASC) 10 MG tablet Take 1 tablet (10 mg total) by mouth at bedtime. 01/09/23   Marylu Lund., NP  gabapentin (NEURONTIN) 600 MG tablet Take 600 mg by mouth 3 (three) times daily. 07/03/22   [provider]  meloxicam (MOBIC) 15 MG tablet Take 1 tablet (15 mg total) by mouth daily. 11/25/22   Dorothyann Peng, PA-C  nitroGLYCERIN (NITROSTAT) 0.4 MG SL tablet Place 1 tablet (0.4 mg total) under the tongue every 5 (five) minutes as needed for chest pain. 10/17/22   Chandrasekhar, Lyda Kalata A, MD  rosuvastatin (CRESTOR) 20 MG tablet Take 1 tablet (20 mg total) by mouth daily. 08/15/22   August Albino, MD  SUBOXONE 8-2 MG FILM Place under the tongue daily. 10/06/22   [provider]  tiZANidine (ZANAFLEX) 4 MG tablet Take 4 mg by mouth 3 (three) times daily as needed. 01/04/23   [provider]  Varenicline Tartrate, Starter, (CHANTIX STARTING MONTH PAK) 0.5 MG X 11 & 1 MG X 42 TBPK Days 1 to 3: 0.5 mg once daily. Days 4 to 7: 0.5 mg twice daily. Then increase to 1 mg twice daily 08/14/22   August Albino, MD      Allergies    Mustard seed    Review of Systems   Review  of Systems  Cardiovascular:  Positive for chest pain and palpitations.  All other systems reviewed and are negative.   Physical Exam Updated Vital Signs BP (!) 162/84   Pulse (!) 102   Temp 98.2 F (36.8 C) (Oral)   Resp 16   Ht 5' 9"$  (1.753 m)   Wt 72.6 kg   SpO2 98%   BMI 23.63 kg/m  Physical Exam Vitals and nursing note reviewed.  Constitutional:      Appearance: Normal appearance.  HENT:     Head: Normocephalic.     Nose: Nose normal.     Mouth/Throat:     Mouth: Mucous membranes are moist.  Eyes:     Extraocular Movements: Extraocular movements intact.     Pupils: Pupils are equal, round, and reactive to light.  Cardiovascular:     Rate and Rhythm: Regular rhythm. Tachycardia present.     Pulses: Normal pulses.     Heart sounds: Normal heart sounds.  Pulmonary:     Effort: Pulmonary effort is normal.     Breath sounds: Normal breath sounds.  Abdominal:     Palpations: Abdomen is soft.  Musculoskeletal:        General: Normal range of motion.     Cervical back: Normal range of motion and neck supple.  Skin:  General: Skin is warm.     Capillary Refill: Capillary refill takes less than 2 seconds.  Neurological:     General: No focal deficit present.     Mental Status: He is alert.  Psychiatric:        Mood and Affect: Mood normal.        Behavior: Behavior normal.     ED Results / Procedures / Treatments   Labs (all labs ordered are listed, but only abnormal results are displayed) Labs Reviewed  CBC - Abnormal; Notable for the following components:      Result Value   MCHC 36.4 (*)    All other components within normal limits  BASIC METABOLIC PANEL  TROPONIN I (HIGH SENSITIVITY)  TROPONIN I (HIGH SENSITIVITY)    EKG EKG Interpretation  Date/Time:  Monday February 09 2023 17:34:33 EST Ventricular Rate:  101 PR Interval:  129 QRS Duration: 81 QT Interval:  322 QTC Calculation: 418 R Axis:   77 Text Interpretation: Sinus tachycardia Since  last tracing rate faster Confirmed by Wandra Arthurs [phone]) on 02/09/2023 6:49:01 PM  Radiology DG Chest 1 View  Result Date: 02/09/2023 CLINICAL DATA:  Chest pain EXAM: CHEST  1 VIEW COMPARISON:  10/08/2022 FINDINGS: The heart size and mediastinal contours are within normal limits. Both lungs are clear. Hardware in the cervical spine. IMPRESSION: No active disease. Electronically Signed   By: Donavan Foil M.D.   On: 02/09/2023 18:05    Procedures Procedures    Medications Ordered in ED Medications  sodium chloride 0.9 % bolus 1,000 mL (has no administration in time range)    ED Course/ Medical Decision Making/ A&P                             Medical Decision Making Rick [name] is a 52 y.o. male here presenting with chest pain and palpitation.  Patient is borderline tachycardic around low 100s.  Reviewed patient's record from cardiology.  He has aortic root dilation and they was scheduled to get a CTA for monitoring.  Consider PE versus ACS.  Low suspicion for dissection.  Plan to get CBC and CMP and troponin x 2 and CTA chest  7:10 PM Troponin is pending and chest x-ray is clear.  Patient states that he needs to go home to give his dog insulin.  He decided to sign out [place].  He understands the risks of heart attack or PE with disability or death.  I told him to follow-up with cardiology  Problems Addressed: Chest pain, unspecified type: acute illness or injury  Amount and/or Complexity of Data Reviewed Labs: ordered. Decision-making details documented in ED Course. Radiology: ordered and independent interpretation performed. Decision-making details documented in ED Course.   Final Clinical Impression(s) / ED Diagnoses Final diagnoses:  Chest pain, unspecified type    Rx / DC Orders ED Discharge Orders     None         Drenda Freeze, MD 02/09/23 1910

## 2023-03-06 ENCOUNTER — Encounter: Payer: Medicaid Other | Admitting: Physical Medicine & Rehabilitation

## 2023-03-20 ENCOUNTER — Ambulatory Visit: Payer: Medicaid Other | Admitting: Nurse Practitioner

## 2023-03-24 ENCOUNTER — Other Ambulatory Visit: Payer: Self-pay | Admitting: Student

## 2023-03-24 DIAGNOSIS — M5412 Radiculopathy, cervical region: Secondary | ICD-10-CM

## 2023-04-17 ENCOUNTER — Other Ambulatory Visit: Payer: Medicaid Other

## 2023-04-19 ENCOUNTER — Telehealth: Payer: Self-pay | Admitting: Internal Medicine

## 2023-04-19 NOTE — Telephone Encounter (Signed)
Heartflow sent plaque data.  Total plaque volume 206 cubic mm. We had already set a goal LDL < 70 and recommended smoking cessation. He has follow up visit in May for f/u labs on rosuvastatin 20 mg.    Did you add a medication? No.  ?If no, reason? Reason for not adding med: Other Pending results on previously escalated therapy from CT  Did you remove a medication? No.  Did you increase the dosage of any medication? No.  Did you decrease the dosage of any medication? No.  Did you refer to a specialist (i.e. lipid clinic, preventive cardiology, endocrinology)? No.  Has patient seen plaque report? No.

## 2023-05-08 ENCOUNTER — Ambulatory Visit: Payer: Medicaid Other | Admitting: Nurse Practitioner

## 2023-05-15 ENCOUNTER — Ambulatory Visit
Admission: RE | Admit: 2023-05-15 | Discharge: 2023-05-15 | Disposition: A | Discharge status: Home / Self Care | Payer: Medicaid Other | Source: Ambulatory Visit | Attending: Student | Admitting: Student

## 2023-05-15 DIAGNOSIS — M5412 Radiculopathy, cervical region: Secondary | ICD-10-CM

## 2023-05-21 ENCOUNTER — Other Ambulatory Visit: Payer: Self-pay | Admitting: Nurse Practitioner

## 2023-05-21 DIAGNOSIS — R030 Elevated blood-pressure reading, without diagnosis of hypertension: Secondary | ICD-10-CM

## 2023-06-15 ENCOUNTER — Telehealth: Payer: Self-pay

## 2023-06-15 NOTE — Telephone Encounter (Signed)
Spoke with patient. Made appt for Derm referral on 7/5 at 11:25a. Aquilla Solian, CMA

## 2023-07-01 ENCOUNTER — Encounter: Payer: Self-pay | Admitting: Family Medicine

## 2023-07-02 NOTE — Progress Notes (Deleted)
  SUBJECTIVE:   CHIEF COMPLAINT / HPI:    ***  PERTINENT  PMH / PSH: ***  Past Medical History:  Diagnosis Date   Back pain    BOILS, RECURRENT 04/13/2007   Qualifier: Diagnosis of  By: Deirdre Priest MD, [place]     Depression    GASTROESOPHAGEAL REFLUX, NO ESOPHAGITIS 02/25/2007   Qualifier: Diagnosis of  By: Haydee Salter     MIGRAINE, UNSPEC., W/O INTRACTABLE MIGRAINE 02/25/2007   Qualifier: Diagnosis of  By: Haydee Salter     Neck pain    OSTEOARTHRITIS OF SPINE, NOS 02/25/2007   Qualifier: Diagnosis of  By: Haydee Salter     PAC (premature atrial contraction) 01/28/2021   Palpitations 10/02/2020    OBJECTIVE:  There were no vitals taken for this visit.  General: NAD, pleasant, able to participate in exam Cardiac: RRR, no murmurs auscultated Respiratory: CTAB, normal WOB Abdomen: soft, non-tender, non-distended, normoactive bowel sounds Extremities: warm and well perfused, no edema or cyanosis Skin: warm and dry, no rashes noted Neuro: alert, no obvious focal deficits, speech normal Psych: Normal affect and mood  ASSESSMENT/PLAN:   There are no diagnoses linked to this encounter. No orders of the defined types were placed in this encounter.  No follow-ups on file.  Vonna Drafts, MD [facility]

## 2023-07-03 ENCOUNTER — Ambulatory Visit: Payer: MEDICAID | Admitting: Physician Assistant

## 2023-07-03 ENCOUNTER — Ambulatory Visit: Payer: Medicaid Other | Admitting: Family Medicine

## 2023-07-24 ENCOUNTER — Ambulatory Visit: Payer: Medicaid Other | Admitting: Family Medicine

## 2023-08-14 ENCOUNTER — Ambulatory Visit: Payer: Medicaid Other | Admitting: Family Medicine

## 2023-09-25 ENCOUNTER — Other Ambulatory Visit: Payer: Self-pay

## 2023-09-25 ENCOUNTER — Encounter: Payer: Self-pay | Admitting: Family Medicine

## 2023-09-25 ENCOUNTER — Ambulatory Visit (INDEPENDENT_AMBULATORY_CARE_PROVIDER_SITE_OTHER): Payer: MEDICAID | Admitting: Family Medicine

## 2023-09-25 VITALS — BP 157/86 | HR 68 | Ht 69.0 in | Wt 158.6 lb

## 2023-09-25 DIAGNOSIS — M25649 Stiffness of unspecified hand, not elsewhere classified: Secondary | ICD-10-CM | POA: Diagnosis not present

## 2023-09-25 DIAGNOSIS — Z13228 Encounter for screening for other metabolic disorders: Secondary | ICD-10-CM

## 2023-09-25 DIAGNOSIS — I10 Essential (primary) hypertension: Secondary | ICD-10-CM | POA: Diagnosis not present

## 2023-09-25 MED ORDER — MELOXICAM 15 MG PO TABS: 15.0000 mg | ORAL_TABLET | Freq: Every day | ORAL | 0 refills | Status: DC

## 2023-09-25 MED ORDER — OLMESARTAN MEDOXOMIL 20 MG PO TABS: 20.0000 mg | ORAL_TABLET | Freq: Every day | ORAL | 3 refills | Status: DC

## 2023-09-25 NOTE — Patient Instructions (Addendum)
Please start taking olmesartan daily for blood pressure  You can try meloxicam daily for arthritis in your hands. You can also try over the counter Voltaren gel as well as hand/wrist braces.  Please return on October 11th for bloodwork, and in 4 weeks to check in on your blood pressure.

## 2023-09-25 NOTE — Progress Notes (Signed)
    SUBJECTIVE:   CHIEF COMPLAINT / HPI:   Hand stiffness and pain, bilateral Ongoing for several months, worsening recently. 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 Some of his knuckles and fingers are a Vargus tender No fevers, rash, redness Has been using ibuprofen and Tylenol fairly regularly Previously used meloxicam for his knee   Elevated BP Denies significant headache, chest pain, shortness of breath, nausea/vomiting/abdominal pain Takes amlodipine 10 mg daily Checks his BP at home, usually runs around 150-160 systolic   PERTINENT  PMH / PSH: Hypertension, OA of spine  OBJECTIVE:   BP (!) 152/87   Pulse 79   Ht 5\' 9"  (1.753 m)   Wt 158 lb 9.6 oz (71.9 kg)   SpO2 100%   BMI 23.42 kg/m    General: NAD, pleasant, able to participate in exam Cardiac: RRR, no murmurs auscultated Respiratory: CTAB, normal WOB Abdomen: soft, non-tender, non-distended, normoactive bowel sounds Extremities:  Bilateral wrists/hands: No gross deformity, ecchymoses, swelling Slightly tender to touch over bony surfaces throughout, worst over first and second metacarpals and knuckles bilaterally Full range of motion of wrist and fingers bilaterally although some stiffness/slowed motion is present Strength 5/5 including grip strength and thumb opposition Negative Tinel/Phalen bilaterally Negative Lourena Simmonds  ASSESSMENT/PLAN:   Assessment & Plan Stiffness of hand joint, unspecified laterality Bilateral osteoarthritis suspected given history of overuse.  Trial meloxicam daily as well as over-the-counter Voltaren gel.  Can trial braces as well.  No recent history of trauma to suggest need for imaging at this time.  No evidence of carpal tunnel or de Quervain's or trigger finger on exam. Hypertension, unspecified type Elevated BP, asymptomatic, though persistently elevated at previous visits as well.   Currently on amlodipine 10 mg daily.  Add olmesartan 20 mg daily today, follow-up in 2 weeks for lab visit to check BMP.  Follow-up in 4 weeks to recheck BP.  If stable on amlodipine and olmesartan, will likely switch to combination pill to reduce pill burden.   Vonna Drafts, MD [facility]

## 2023-10-09 ENCOUNTER — Other Ambulatory Visit: Payer: MEDICAID

## 2023-10-09 DIAGNOSIS — I10 Essential (primary) hypertension: Secondary | ICD-10-CM

## 2023-10-09 DIAGNOSIS — Z13228 Encounter for screening for other metabolic disorders: Secondary | ICD-10-CM

## 2023-10-10 LAB — HEMOGLOBIN A1C
Est. average glucose Bld gHb Est-mCnc: 105 mg/dL
Hgb A1c MFr Bld: 5.3 % (ref 4.8–5.6)

## 2023-10-10 LAB — BASIC METABOLIC PANEL
BUN/Creatinine Ratio: 7 — ABNORMAL LOW (ref 9–20)
BUN: 8 mg/dL (ref 6–24)
CO2: 23 mmol/L (ref 20–29)
Calcium: 9.9 mg/dL (ref 8.7–10.2)
Chloride: 95 mmol/L — ABNORMAL LOW (ref 96–106)
Creatinine, Ser: 1.2 mg/dL (ref 0.76–1.27)
Glucose: 113 mg/dL — ABNORMAL HIGH (ref 70–99)
Potassium: 5 mmol/L (ref 3.5–5.2)
Sodium: 132 mmol/L — ABNORMAL LOW (ref 134–144)
eGFR: 72 mL/min/{1.73_m2} (ref >59)

## 2023-10-22 ENCOUNTER — Other Ambulatory Visit: Payer: Self-pay | Admitting: Family Medicine

## 2023-10-22 DIAGNOSIS — M25649 Stiffness of unspecified hand, not elsewhere classified: Secondary | ICD-10-CM

## 2023-11-05 NOTE — Progress Notes (Deleted)
    SUBJECTIVE:   CHIEF COMPLAINT / HPI:   Hypertension follow-up Current regimen includes amlodipine 10 mg daily and olmesartan 20 mg daily (olmesartan started at last visit 9/27) Consider switch to combo pill if doing well *** ***  PERTINENT  PMH / PSH: ***  OBJECTIVE:   There were no vitals taken for this visit.  ***  ASSESSMENT/PLAN:   Assessment & Plan    Rick Drafts, MD [facility]

## 2023-11-06 ENCOUNTER — Ambulatory Visit: Payer: MEDICAID | Admitting: Family Medicine

## 2023-11-12 NOTE — Progress Notes (Deleted)
    SUBJECTIVE:   CHIEF COMPLAINT / HPI:   Hypertension follow-up Current regimen includes amlodipine 10 mg daily and olmesartan 20 mg daily (olmesartan started at last visit 9/27) Consider switch to combo pill if doing well *** ***  PERTINENT  PMH / PSH: ***  OBJECTIVE:   There were no vitals taken for this visit.  ***  ASSESSMENT/PLAN:   Assessment & Plan    Vonna Drafts, MD [facility]

## 2023-11-13 ENCOUNTER — Ambulatory Visit: Payer: MEDICAID | Admitting: Family Medicine

## 2023-11-21 ENCOUNTER — Other Ambulatory Visit: Payer: Self-pay | Admitting: Family Medicine

## 2023-11-21 DIAGNOSIS — E785 Hyperlipidemia, unspecified: Secondary | ICD-10-CM

## 2023-11-23 ENCOUNTER — Ambulatory Visit: Payer: MEDICAID | Admitting: Family Medicine

## 2023-11-28 ENCOUNTER — Other Ambulatory Visit: Payer: Self-pay | Admitting: Internal Medicine

## 2023-12-15 ENCOUNTER — Ambulatory Visit: Payer: MEDICAID | Attending: Internal Medicine

## 2023-12-15 ENCOUNTER — Telehealth: Payer: Self-pay | Admitting: Internal Medicine

## 2023-12-15 DIAGNOSIS — R002 Palpitations: Secondary | ICD-10-CM

## 2023-12-15 MED ORDER — METOPROLOL TARTRATE 25 MG PO TABS: 12.5000 mg | ORAL_TABLET | Freq: Two times a day (BID) | ORAL | 1 refills | Status: DC | PRN

## 2023-12-15 NOTE — Telephone Encounter (Signed)
Called pt advised of MD response:  Agreed - he has paroxysmal SVT and PACs and had metoprolol 12.5 mg PO as a PRN at our prior visits - would repeat his Ziopatch, given frequency of his sx 3 days should suffice - if symptomatic can take his prior metoprolol - based on results may add standing therapy  Riley Lam, MD [facility] Cardiologist [facility] [address] [place], [place] [address] [phone] 3:23 PM   Pt would like to take metoprolol 12.5 mg PO BID as needed for palpitations.  2 week heart monitor  ordered pt request shipment to [address] [place], [place] [address].  Pt reports does not need instructions.  All questions answered.

## 2023-12-15 NOTE — Telephone Encounter (Signed)
Pt reporting "skipped beats, most of the day, on and off". "Like every 10-15 minutes he is feeling a "skipped beat".  Says it has been occurring daily and the last 2-3 weeks but was only several times/year before this.  Says it is "very obvious".  He does get light headed with episodes but quickly resolves once episode ends. Denies CP, SOB, edema/wt gain. He is unsure of HRs, he does not have Kardia mobile/smart watch.  He does have BP cuff at home but it does not tell regular vs irregular rhythm. Pt aware forwarding to MD for advisement.  Informed that he may order a monitor before making further decision/advisement. Pt agreeable to plan and aware office will follow up after MD review.

## 2023-12-15 NOTE — Progress Notes (Unsigned)
Enrolled for [employer] to mail a ZIO XT long term holter monitor to [address] [place], [place]  [address].

## 2023-12-15 NOTE — Telephone Encounter (Signed)
Patient c/o Palpitations:  High priority if patient c/o lightheadedness, shortness of breath, or chest pain  How long have you had palpitations/irregular HR/ Afib? Are you having the symptoms now? More persistent in the last week.    Are you currently experiencing lightheadedness, SOB or CP? Lightheadedness and SOB currently   Do you have a history of afib (atrial fibrillation) or irregular heart rhythm? Yes   Have you checked your BP or HR? (document readings if available): 172/74 HR 98  Are you experiencing any other symptoms? No

## 2023-12-21 DIAGNOSIS — R002 Palpitations: Secondary | ICD-10-CM | POA: Diagnosis not present

## 2024-01-04 ENCOUNTER — Ambulatory Visit: Payer: MEDICAID | Admitting: Family Medicine

## 2024-01-11 ENCOUNTER — Ambulatory Visit: Payer: MEDICAID | Admitting: Family Medicine

## 2024-01-14 ENCOUNTER — Telehealth: Payer: Self-pay

## 2024-01-14 DIAGNOSIS — I77810 Thoracic aortic ectasia: Secondary | ICD-10-CM

## 2024-01-14 MED ORDER — METOPROLOL TARTRATE 25 MG PO TABS: 12.5000 mg | ORAL_TABLET | Freq: Two times a day (BID) | ORAL | 3 refills | Status: DC

## 2024-01-14 NOTE — Telephone Encounter (Signed)
The patient has been notified of the result and verbalized understanding.  All questions (if any) were answered. Geovanni Rahming N Donzel Romack, RN 01/14/2024 10:26 AM   Pt reports takes metoprolol tartrate 12.5 mg PO BID PRN without difficulty.  I spoke with MD he would like pt to take metoprolol tartrate 12.5 mg PO BID if has difficulty will change to bisoprolol.   Pt reports has the flu and canceled CT Aorta was advised will need another order.  Present order expires on 01/15/24.  New order placed.

## 2024-01-14 NOTE — Telephone Encounter (Signed)
-----   Message from Christell Constant sent at 01/12/2024  6:48 PM EST ----- Results: Return of SVT (One of the problems we discussed in 2021) In 2022, on resting metoprolol had diarrhea and felt sluggish and weak.  This resolved off of metoprolol. Plan: Bisoprolol 2.5 mg PO daily  Christell Constant, MD

## 2024-01-15 ENCOUNTER — Ambulatory Visit (HOSPITAL_BASED_OUTPATIENT_CLINIC_OR_DEPARTMENT_OTHER): Payer: MEDICAID

## 2024-01-18 ENCOUNTER — Ambulatory Visit: Payer: MEDICAID | Admitting: Family Medicine

## 2024-01-29 ENCOUNTER — Ambulatory Visit: Payer: MEDICAID | Admitting: Family Medicine

## 2024-02-09 ENCOUNTER — Ambulatory Visit: Payer: MEDICAID | Admitting: Family Medicine

## 2024-02-15 ENCOUNTER — Other Ambulatory Visit: Payer: Self-pay | Admitting: Internal Medicine

## 2024-02-16 ENCOUNTER — Other Ambulatory Visit: Payer: Self-pay | Admitting: Nurse Practitioner

## 2024-02-16 DIAGNOSIS — R030 Elevated blood-pressure reading, without diagnosis of hypertension: Secondary | ICD-10-CM

## 2024-02-20 ENCOUNTER — Other Ambulatory Visit: Payer: Self-pay | Admitting: Student

## 2024-02-20 DIAGNOSIS — M5416 Radiculopathy, lumbar region: Secondary | ICD-10-CM

## 2024-02-20 DIAGNOSIS — M5412 Radiculopathy, cervical region: Secondary | ICD-10-CM

## 2024-02-23 ENCOUNTER — Encounter: Payer: Self-pay | Admitting: Family Medicine

## 2024-02-23 ENCOUNTER — Ambulatory Visit (INDEPENDENT_AMBULATORY_CARE_PROVIDER_SITE_OTHER): Payer: MEDICAID | Admitting: Family Medicine

## 2024-02-23 VITALS — BP 140/78 | HR 77 | Ht 69.0 in | Wt 165.2 lb

## 2024-02-23 DIAGNOSIS — I10 Essential (primary) hypertension: Secondary | ICD-10-CM

## 2024-02-23 DIAGNOSIS — M25649 Stiffness of unspecified hand, not elsewhere classified: Secondary | ICD-10-CM | POA: Diagnosis not present

## 2024-02-23 DIAGNOSIS — M25561 Pain in right knee: Secondary | ICD-10-CM | POA: Diagnosis not present

## 2024-02-23 DIAGNOSIS — G8929 Other chronic pain: Secondary | ICD-10-CM

## 2024-02-23 MED ORDER — AMLODIPINE-OLMESARTAN 10-40 MG PO TABS: 1.0000 | ORAL_TABLET | Freq: Every day | ORAL | 3 refills | Status: DC

## 2024-02-23 NOTE — Patient Instructions (Addendum)
 Voltaren gel for hands  I placed a referral to orthopedic surgery and Occupational Therapy  Please take Azor 10-40mg  daily for blood pressure and keep checking your BP daily  Follow up in 4 weeks for BP check

## 2024-02-23 NOTE — Progress Notes (Signed)
    SUBJECTIVE:   CHIEF COMPLAINT / HPI:   Hypertension On amlodipine 10 mg daily, olmesartan 20 mg daily, metoprolol Ranges around 140s systolic at home Denies chest pain, shortness of breath, headache, blurry vision, abdominal pain  B/l knuckle/hand pain and swelling/stiffness Ongoing for a long time Used to do kung fu for 28 years Plays guitar very often and uses hands frequently at work Meloxicam didn't help much Has not tried Voltaren gel  Also having right knee pain.  States that he had meniscal tear and underwent surgery with [facility].  Since then has had pain especially with standing and twisting.  Occasionally swells up.  Has not seen Ortho in a while.   PERTINENT  PMH / PSH: Meniscus tear of right knee, hypertension, osteoarthritis of the spine  OBJECTIVE:   BP (!) 140/78   Pulse 77   Ht 5\' 9"  (1.753 m)   Wt 165 lb 3.2 oz (74.9 kg)   SpO2 98%   BMI 24.40 kg/m   General: NAD, pleasant, able to participate in exam Cardiac: RRR, no murmurs auscultated Respiratory: CTAB, normal WOB Abdomen: soft, non-tender, non-distended, normoactive bowel sounds Extremities: warm and well perfused, no edema or cyanosis   MSK: Bilateral wrists/hands without significant deformity or swelling or ecchymoses.  Does have some old scarring and skin changes/callus formation suggestive of overuse.  Dorsum of hands and knuckles bilaterally are mildly tender to touch but do not appear overtly swollen.  He does have preserved range of motion without significant deviation of his interphalangeal joints.  Does have some deviation at the MCP bilaterally.  Right knee without significant swelling, ecchymoses, or deformity.  Mildly tender at the joint line.  Normal range of motion.  Pain with Thessaly's.  Drawers negative.  ASSESSMENT/PLAN:   Assessment & Plan Hypertension, unspecified type Uncontrolled, BP above goal.  Increase olmesartan to 40 mg daily.  Sent Azor (combo of  amlodipine-olmesartan 10-40 mg) to reduce pill burden.  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  Lower suspicion for rheumatoid/autoimmune process.  Placed referral to Occupational Therapy as this may help with his dexterity.  Also placed referral to orthopedic surgery as he has seen them in the past for his knee and he prefers to follow-up with them.  Has used meloxicam in the past without relief, advised Voltaren gel use Chronic pain of right knee Underwent meniscus surgery with [facility] in 2023, placed referral given continued pain   Vonna Drafts, MD [facility]

## 2024-04-01 ENCOUNTER — Encounter: Payer: MEDICAID | Admitting: Occupational Therapy

## 2024-04-05 ENCOUNTER — Encounter: Payer: Self-pay | Admitting: Neurosurgery

## 2024-04-08 ENCOUNTER — Ambulatory Visit
Admission: RE | Admit: 2024-04-08 | Discharge: 2024-04-08 | Disposition: A | Discharge status: Home / Self Care | Payer: MEDICAID | Source: Ambulatory Visit | Attending: Student | Admitting: Student

## 2024-04-08 DIAGNOSIS — M5416 Radiculopathy, lumbar region: Secondary | ICD-10-CM

## 2024-04-08 DIAGNOSIS — M5412 Radiculopathy, cervical region: Secondary | ICD-10-CM

## 2024-04-08 MED ORDER — GADOPICLENOL 0.5 MMOL/ML IV SOLN
7.5000 mL | Freq: Once | INTRAVENOUS | Status: AC | PRN
  Administered 2024-04-08: 7.5 mL via INTRAVENOUS

## 2024-04-22 ENCOUNTER — Encounter: Payer: MEDICAID | Admitting: Occupational Therapy

## 2024-05-20 ENCOUNTER — Encounter: Payer: MEDICAID | Admitting: Occupational Therapy

## 2024-06-14 ENCOUNTER — Ambulatory Visit: Payer: MEDICAID | Attending: Family Medicine | Admitting: Occupational Therapy

## 2024-06-14 ENCOUNTER — Other Ambulatory Visit: Payer: Self-pay

## 2024-06-14 ENCOUNTER — Encounter: Payer: Self-pay | Admitting: Occupational Therapy

## 2024-06-14 DIAGNOSIS — R278 Other lack of coordination: Secondary | ICD-10-CM | POA: Insufficient documentation

## 2024-06-14 DIAGNOSIS — R208 Other disturbances of skin sensation: Secondary | ICD-10-CM | POA: Diagnosis present

## 2024-06-14 DIAGNOSIS — M25641 Stiffness of right hand, not elsewhere classified: Secondary | ICD-10-CM | POA: Diagnosis present

## 2024-06-14 DIAGNOSIS — M6281 Muscle weakness (generalized): Secondary | ICD-10-CM | POA: Diagnosis present

## 2024-06-14 DIAGNOSIS — M79642 Pain in left hand: Secondary | ICD-10-CM | POA: Insufficient documentation

## 2024-06-14 DIAGNOSIS — M25649 Stiffness of unspecified hand, not elsewhere classified: Secondary | ICD-10-CM | POA: Diagnosis not present

## 2024-06-14 DIAGNOSIS — M79641 Pain in right hand: Secondary | ICD-10-CM | POA: Insufficient documentation

## 2024-06-14 DIAGNOSIS — M25642 Stiffness of left hand, not elsewhere classified: Secondary | ICD-10-CM | POA: Diagnosis present

## 2024-06-14 NOTE — Therapy (Signed)
 OUTPATIENT OCCUPATIONAL THERAPY ORTHO EVALUATION  Patient Name: Rick [name] MRN: [id number] DOB:06-19-1971, 53 y.o., male Today's Date: 06/14/2024  PCP: Edison Gore, MD REFERRING PROVIDER: Charmel Cooter, MD  END OF SESSION:  OT End of Session - 06/14/24 1234     Visit Number 1    Number of Visits 10    Date for OT Re-Evaluation 08/12/24    Authorization Type [employer] Auth    Authorization Time Period Auth Reqd [id number]    OT Start Time 1235    OT Stop Time 1330    OT Time Calculation (min) 55 min    Equipment Utilized During Treatment Testing Material    Activity Tolerance Patient tolerated treatment well    Behavior During Therapy WFL for tasks assessed/performed          Past Medical History:  Diagnosis Date   Back pain    BOILS, RECURRENT 04/13/2007   Qualifier: Diagnosis of  By: Lanetta Pion MD, [place]     Depression    GASTROESOPHAGEAL REFLUX, NO ESOPHAGITIS 02/25/2007   Qualifier: Diagnosis of  By: Eleni Griffin     MIGRAINE, UNSPEC., W/O INTRACTABLE MIGRAINE 02/25/2007   Qualifier: Diagnosis of  By: Eleni Griffin     Neck pain    OSTEOARTHRITIS OF SPINE, NOS 02/25/2007   Qualifier: Diagnosis of  By: Eleni Griffin     PAC (premature atrial contraction) 01/28/2021   Palpitations 10/02/2020   Past Surgical History:  Procedure Laterality Date   LUMBAR LAMINECTOMY/DECOMPRESSION MICRODISCECTOMY Right 01/03/2022   Procedure: Microdiscectomy - L2-L3 - right;  Surgeon: Gearl Keens, MD;  [facility] OR;  Service: Neurosurgery;  Laterality: Right;  3C   NECK SURGERY     x 3   TONSILLECTOMY Bilateral 1976   Patient Active Problem List   Diagnosis Date Noted   Chest pain of uncertain etiology 10/17/2022   Hyperlipidemia 08/15/2022   Elevated blood pressure reading 08/14/2022   Encounter for smoking cessation counseling 08/14/2022   Sebaceous cyst 08/14/2022   Lumbar disc herniation 01/03/2022   HNP (herniated nucleus pulposus), lumbar 01/03/2022    Paroxysmal SVT (supraventricular tachycardia) (HCC) 06/24/2021   PAC (premature atrial contraction) 01/28/2021   Back pain 01/24/2021   Tobacco abuse 10/26/2020   NECK MASS 06/26/2008   BOILS, RECURRENT 04/13/2007   ANXIETY 02/25/2007   DEPRESSIVE DISORDER, NOS 02/25/2007   MIGRAINE, UNSPEC., W/O INTRACTABLE MIGRAINE 02/25/2007   GASTROESOPHAGEAL REFLUX, NO ESOPHAGITIS 02/25/2007   OSTEOARTHRITIS OF SPINE, NOS 02/25/2007    ONSET DATE: 02/23/2024  REFERRING DIAG: M25.649 (ICD-10-CM) - Stiffness of hand joint, unspecified laterality  THERAPY DIAG:  Pain in right hand  Pain in left hand  Muscle weakness (generalized)  Stiffness of right hand, not elsewhere classified  Other disturbances of skin sensation  Stiffness of left hand, not elsewhere classified  Other lack of coordination  Rationale for Evaluation and Treatment: Rehabilitation  SUBJECTIVE:   SUBJECTIVE STATEMENT: Pt reports he has had several OT eval appts scheduled but they were cancelled for different reasons including illnesses of himself and his father whom he lives with.  Pt has had 3 previous appts that appear to be cancelled by pt due to transportation issues etc. Pt reports he saw Dr. Baby Bolt again andhe sent him back for therapy.  Last MD note is 02/23/24.  Pt reports his hands swell, ache, get numb (likely part of cervical issues), he can't even squeeze or make a fist sometimes and the R hand is the worst. Pt reports he  might wake up in the morning and his hands will be swollen, and as soon as he starts a project with intense labor - they swell up and it's crazy how much they start aching.  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  Lower suspicion for rheumatoid/autoimmune process.  Placed referral to Occupational Therapy as this may help with his dexterity.  Also placed referral to orthopedic surgery as he has seen them in the past for his knee and he prefers to follow-up with them.  Has used meloxicam  in the past without relief, advised Voltaren  gel use  Bilateral wrists/hands without significant deformity or swelling or ecchymoses. Does have some old scarring and skin changes/callus formation suggestive of overuse. Dorsum of hands and knuckles bilaterally are mildly tender to touch but do not appear overtly swollen. He does have preserved range of motion without significant deviation of his interphalangeal joints. Does have some deviation at the MCP bilaterally.  PRECAUTIONS: None  RED FLAGS: None   WEIGHT BEARING RESTRICTIONS: No  PAIN: Pain at worst - can get up to 10/10 and will take Suboxone, will try  Are you having pain? Yes: NPRS scale: 2-3 Pain location: More fingers Pain description: pounding and aching Aggravating factors: intense work Relieving factors: time, resting them  Not worn splints, uses heat and cold (electric heat pad and cold pack for hands), tried gels but they didn't, soak in cold ice water  FALLS: Has patient fallen in last 6 months? No  LIVING ENVIRONMENT: Lives with: lives with their family and father Lives in: House/apartment Stairs: Yes: External: 2 - stoop steps; none Has following equipment at home: Father has a few things Pt reports he has a hand roller, ice pack, and heating pad  PLOF: Independent  PATIENT GOALS: Some relief  NEXT MD VISIT: Needs to schedule one  OBJECTIVE:  Note: Objective measures were completed at Evaluation unless otherwise noted.  HAND DOMINANCE: Right  ADLs: WFL - but when his hands get  swollen and achy he has trouble tying his shoelaces, managing buttons etc.   FUNCTIONAL OUTCOME MEASURES: Quick Dash: 29.5  UPPER EXTREMITY ROM:    BUE shoulders, elbows etc - generally WFL with slight end range changes   R fist is slightly limited from full composite digital flexion ie) I can't close my hand all the way  UPPER EXTREMITY MMT:    BUE generally 4/5  HAND FUNCTION: Grip strength: Right: 48.9, 31.0, 27.3  lbs; Left: 77.8, 74.2, 76.2  lbs Average Right: 35.7 lbs Left 76.1 lbs  COORDINATION: 9 Hole Peg test: Right: 35.35 sec; Left: 28.76 sec Pt reported, It's hard to feel the pegs with hands that are numb.  SENSATION: Numb/lack of sensation B fingers (palmar/dorsal)  - sometimes it's worse than others   EDEMA: B hands  COGNITION: Overall cognitive status: Within functional limits for tasks assessed   OBSERVATIONS: Pt ambulates with no AE and no loss of balance. The pt has calloused hands.  OTR in agreement with MD note, Bilateral  wrists/hands without significant deformity or swelling or ecchymoses. Does have some old scarring and skin changes/callus formation suggestive of overuse.   TODAY'S TREATMENT: Not billed due to payor                                                                                                                        Self Care: Education initiated re: Joint Protection ie)  -Respecting pain/Develop pain awareness - planning rest breaks before or when discomfort or fatigue develops -Use larger joints and muscles - hug loads like laundry basket -Avoid tight/strong and prolonged grasp - build up handles (foam or cloth)  -Consider using splints to protect joints  Therapeutic Exercises: Pt issued tendon gliding exercises/handout with review of motions to isolate DIP, PIP and MCP joints for straight finger position, hook (DIP/PIP flexion), fist (DIP/PIP/MCP flexion), taco/duck (MCP flexion only) and flat fist (MCP and PIP flexion).  Education provided on purpose of tendon glide exercises ie) to increase the circulation to the hand and wrist, reduce swelling and promote healthier soft tissue for increased AROM and not to build hand or wrist strength at this time.  PATIENT EDUCATION: Education details: OT Role, POC considerations, Joint protection, Tendon Glides Person educated: Patient Education method: Explanation, Demonstration, Tactile cues, Verbal cues, and Handouts Education comprehension: verbalized understanding, returned demonstration, verbal cues required, tactile cues required, and needs further education  HOME EXERCISE PROGRAM: 06/14/24: tendon gliding exercises  GOALS: Goals reviewed with patient? Yes    SHORT TERM GOALS: Target date: 07/15/24   Patient will demonstrate initial B HEP with 25% verbal cues or less for proper execution. Baseline: New to outpt OT Goal status: IN Progress - Tendon Gliding Exc issued at eval.   2.  Pt to trial prefab hand splints to help improve pain and improve overall comfort for functional use of RUE/LUE. Recommend prefab vs fab due to chronicity of issue, adjustability, comfort, and ease of cleaning. Baseline: Education provided at eval Goal status: IN Progress   3.  Pt will independently recall at least 3 joint protection, ergonomics, and body mechanic principles as noted in pt instructions to assist with daily tasks with increased comfort and confidence.  Baseline:  New to outpt OT - Education initiated at eval Goal status: IN Progress  4. Pt will independently recall the 5 main sensory precautions (cold, heat, sharp, chemical, and heavy) as needed to prevent injury/harm secondary to impairments.   Baseline: New to outpt OT  Goal Status: INITIAL    5.  Pt will independently recall sleep positioning options as noted in pt instructions to improve sleep disturbances from moderate to minimal.  Baseline:  min difficulty per QuickDash Goal status: INITIAL     LONG TERM  GOALS: Target date: 08/12/24   Patient will demonstrate updated BUE HEP with visual instruction only for proper execution. Baseline: New to outpt OT Goal status: IN Progress - Tendon Gliding Exc issued at eval.   2.  Pt will independently recall at least  3 options for adaptive equipment to assist with joint protection, ergonomics, and body mechanic principles as noted in pt instructions for ADLs and IADLS.  Baseline:  New to outpt OT Goal status: INITIAL   3.  Pt will be independent with home based pain management routine to potentially include gloves/splints, heat and joint protection principles for minimal pain. Baseline: 10/10 at worst Goal status: INITIAL  4.  Patient will demonstrate at least 35-40 lb in dominant RUE grip strength x 3 reps as needed to open jars and other containers. Baseline: Average Right: 35.7 lbs Left 76.1 lbs Right trials at Eval: 48.9, 31.0, 27.3 lbs Goal status: INITIAL   5.  Patient will demonstrate at least improvement with Quick Dash score from moderate to mild tingling and pain in BUEs.  Baseline: QuickDash 29.5% - Moderate pain/tingling Goal status: INITIAL   ASSESSMENT:  CLINICAL IMPRESSION: Patient is a 53 y.o. male who was seen today for occupational therapy evaluation for B hand/wrist stiffness and pain - likely OA. Hx includes multiple broken bones per pt report as well as migraine, GERD, tachycardia, OA spine/lumbar disc herniations, anxiety/depression and h/o tobacco use which he reports he quit 1 year ago. Patient currently presents functional deficits and impairments in B UEs especially R UE strength and coordination. Pt would benefit from skilled OT services in the outpatient setting to work on impairments as noted below to help pt return to PLOF as able.    PERFORMANCE DEFICITS: in functional skills including ADLs, IADLs, coordination, dexterity, proprioception, sensation, edema, tone, ROM, strength, pain, fascial restrictions, flexibility,  Fine motor control, Gross motor control, body mechanics, endurance, decreased knowledge of precautions, decreased knowledge of use of DME, skin integrity, and UE functional use, cognitive skills including problem solving and safety awareness, and psychosocial skills including coping strategies, environmental adaptation, habits, and routines and behaviors.   IMPAIRMENTS: are limiting patient from ADLs, IADLs, rest and sleep, work, leisure, and social participation.   COMORBIDITIES: may have co-morbidities  that affects occupational performance. Patient will benefit from skilled OT to address above impairments and improve overall function.  MODIFICATION OR ASSISTANCE TO COMPLETE EVALUATION: Maximum or significant modification of tasks or assist is necessary to complete an evaluation.  OT OCCUPATIONAL PROFILE AND HISTORY: Comprehensive assessment: Review of records and extensive additional review of physical, cognitive, psychosocial history related to current functional performance.  CLINICAL DECISION MAKING: High - multiple treatment options, significant modification of task necessary  REHAB POTENTIAL: Good  EVALUATION COMPLEXITY: High    PLAN:  OT FREQUENCY: 1-2x/week  OT DURATION: 6 weeks  PLANNED INTERVENTIONS: 97168 OT Re-evaluation, 97535 self care/ADL training, 96045 therapeutic exercise, 97530 therapeutic activity, 97112 neuromuscular re-education, 97140 manual therapy, 97035 ultrasound, 97018 paraffin, 40981 fluidotherapy, 97010 moist heat, 97010 cryotherapy, 97760 Orthotic Initial, 97763 Orthotic/Prosthetic subsequent, energy conservation, coping strategies training, patient/family education, and DME and/or AE instructions  RECOMMENDED OTHER SERVICES: NA  CONSULTED AND AGREED WITH PLAN OF CARE: Patient  PLAN FOR NEXT SESSIONS:  Review tendon glides Joint protection handouts AE considerations for work/home Nerve glides as appropriate Sleep positions Sensory  Precautions   Zora Hires, OT 06/14/2024, 8:29 PM

## 2024-06-14 NOTE — Patient Instructions (Addendum)
   WEBSITE: [email or web address]

## 2024-06-30 ENCOUNTER — Ambulatory Visit: Payer: MEDICAID | Admitting: Family Medicine

## 2024-07-04 ENCOUNTER — Other Ambulatory Visit: Payer: Self-pay | Admitting: Family Medicine

## 2024-07-04 DIAGNOSIS — I10 Essential (primary) hypertension: Secondary | ICD-10-CM

## 2024-10-16 ENCOUNTER — Other Ambulatory Visit: Payer: Self-pay | Admitting: Family Medicine

## 2024-10-16 DIAGNOSIS — I10 Essential (primary) hypertension: Secondary | ICD-10-CM

## 2024-12-09 ENCOUNTER — Other Ambulatory Visit: Payer: Self-pay | Admitting: Neurosurgery

## 2024-12-09 ENCOUNTER — Telehealth (HOSPITAL_BASED_OUTPATIENT_CLINIC_OR_DEPARTMENT_OTHER): Payer: Self-pay | Admitting: *Deleted

## 2024-12-09 NOTE — Telephone Encounter (Signed)
° °  Name: Rick [name]  DOB: 1971-05-16  MRN: [id number]  Primary Cardiologist: Stanly DELENA Leavens, MD  Chart reviewed as part of pre-operative protocol coverage. Because of Rick [name]'s past medical history and time since last visit, he will require a follow-up in-office visit in order to better assess preoperative cardiovascular risk.Has not been seen since 01/09/2023  Pre-op covering staff: - Please schedule appointment and call patient to inform them. If patient already had an upcoming appointment within acceptable timeframe, please add pre-op clearance to the appointment notes so provider is aware. - Please contact requesting surgeon's office via preferred method (i.e, phone, fax) to inform them of need for appointment prior to surgery.    Lamarr Satterfield, NP  12/09/2024, 2:22 PM

## 2024-12-09 NOTE — Telephone Encounter (Signed)
 POV preop clearance appt now scheduled, med rec and consent done

## 2024-12-09 NOTE — Telephone Encounter (Signed)
° °  Pre-operative Risk Assessment    Patient Name: Rick [name]  DOB: 1971-07-21 MRN: [id number]   Date of last office visit: 01/09/2023 Date of next office visit: None  Request for Surgical Clearance    Procedure:  L4-5 Lumbar Laminectomy  Date of Surgery:  Clearance 01/09/25                                 Surgeon:  Jennifer Helling Surgeon's Group or Practice Name:  [facility] Phone number:  [phone] Fax number:  [phone]   Type of Clearance Requested:   - Medical    Type of Anesthesia:  General    Additional requests/questions:    Signed, Edsel Grayce Sanders   12/09/2024, 2:13 PM

## 2024-12-30 NOTE — Progress Notes (Signed)
 " Cardiology Office Note:    Date:  01/02/2025   ID:  [name], DOB [birth year], MRN [id number]  PCP:  Romelle Booty, MD   [facility] Providers Cardiologist:  Stanly DELENA Leavens, MD     Referring MD: Romelle Booty, MD   Chief Complaint  Patient presents with   Pre-op Exam    Aortic valve dilation    History of Present Illness:    Rick [name] is a 54 y.o. male with a hx of HTN, aortic dilation, nonobstructive CAD, SVT, tobacco abuse, and depression. He was evaluated by cardiology for palpitations. Zio patch showed occasional symptomatic PACs. Palpitations terminated with vagal maneuvers. BB caused diarrhea and sluggishness. CCTA with nonobstructive CAD, risk factor management recommended.   He is now pending lumbar laminectomy and we are asked for preoperative risk evaluation.   SBP significantly elevated today at 188/92. He is complaint on medications and states he took the stairs. He recently started on suboxone due to opioid dependence, but did not take today. He denies significant back pain today.   He is on disability, works as surveyor, mining. No dedicated exercise regimen. He climbed a flight of stairs to our floor today without chest pain. He is only taking lopressor  daily instead of BID.   Past Medical History:  Diagnosis Date   Back pain    BOILS, RECURRENT 04/13/2007   Qualifier: Diagnosis of  By: Jeanelle MD, [place]     Depression    GASTROESOPHAGEAL REFLUX, NO ESOPHAGITIS 02/25/2007   Qualifier: Diagnosis of  By: Damien Folks     MIGRAINE, UNSPEC., W/O INTRACTABLE MIGRAINE 02/25/2007   Qualifier: Diagnosis of  By: Damien Folks     Neck pain    OSTEOARTHRITIS OF SPINE, NOS 02/25/2007   Qualifier: Diagnosis of  By: Damien Folks     PAC (premature atrial contraction) 01/28/2021   Palpitations 10/02/2020    Past Surgical History:  Procedure Laterality Date   LUMBAR LAMINECTOMY/DECOMPRESSION MICRODISCECTOMY Right 01/03/2022    Procedure: Microdiscectomy - L2-L3 - right;  Surgeon: Onetha Kuba, MD;  Location: [facility] OR;  Service: Neurosurgery;  Laterality: Right;  3C   NECK SURGERY     x 3   TONSILLECTOMY Bilateral 1976    Current Medications: Active Medications[1]   Allergies:   Mustard   Social History   Socioeconomic History   Marital status: Married    Spouse name: Rick [name]   Number of children: 2   Years of education: Not on file   Highest education level: 12th grade  Occupational History   Occupation: Disability  Tobacco Use   Smoking status: Former    Current packs/day: 0.10    Average packs/day: 0.1 packs/day for 35.0 years (3.5 ttl pk-yrs)    Types: Cigarettes    Passive exposure: Current   Smokeless tobacco: Never  Vaping Use   Vaping status: Never Used  Substance and Sexual Activity   Alcohol use: Not Currently   Drug use: No   Sexual activity: Yes  Other Topics Concern   Not on file  Social History Narrative   Not on file   Social Drivers of Health   Tobacco Use: Medium Risk (06/14/2024)   Patient History    Smoking Tobacco Use: Former    Smokeless Tobacco Use: Never    Passive Exposure: Current  Physicist, Medical Strain: High Risk (01/14/2024)   Overall Financial Resource Strain (CARDIA)    Difficulty of Paying Living Expenses: Very hard  Food Insecurity: Food Insecurity  Present (01/14/2024)   Hunger Vital Sign    Worried About Running Out of Food in the Last Year: Often true    Ran Out of Food in the Last Year: Often true  Transportation Needs: No Transportation Needs (01/14/2024)   PRAPARE - Administrator, Civil Service (Medical): No    Lack of Transportation (Non-Medical): No  Physical Activity: Unknown (01/14/2024)   Exercise Vital Sign    Days of Exercise per Week: 0 days    Minutes of Exercise per Session: Not on file  Stress: Stress Concern Present (01/14/2024)   [employer] of Occupational Health - Occupational Stress Questionnaire    Feeling of  Stress : To some extent  Social Connections: Socially Isolated (01/14/2024)   Social Connection and Isolation Panel    Frequency of Communication with Friends and Family: More than three times a week    Frequency of Social Gatherings with Friends and Family: Twice a week    Attends Religious Services: Never    Database Administrator or Organizations: No    Attends Engineer, Structural: Not on file    Marital Status: Never married  Depression (PHQ2-9): Medium Risk (02/23/2024)   Depression (PHQ2-9)    PHQ-2 Score: 5  Alcohol Screen: Low Risk (01/14/2024)   Alcohol Screen    Last Alcohol Screening Score (AUDIT): 5  Housing: High Risk (01/14/2024)   Housing Stability Vital Sign    Unable to Pay for Housing in the Last Year: Yes    Number of Times Moved in the Last Year: 0    Homeless in the Last Year: No  Utilities: Not on file  Health Literacy: Not on file     Family History: The patient's family history includes Cancer in his father, maternal grandfather, and mother.  ROS:   Please see the history of present illness.     All other systems reviewed and are negative.  EKGs/Labs/Other Studies Reviewed:    The following studies were reviewed today:  EKG Interpretation Date/Time:  Monday January 02 2025 13:52:47 EST Ventricular Rate:  62 PR Interval:  144 QRS Duration:  82 QT Interval:  384 QTC Calculation: 389 R Axis:   69  Text Interpretation: Normal sinus rhythm Normal ECG When compared with ECG of 09-Feb-2023 17:34, PREVIOUS ECG IS PRESENT Confirmed by Madie Slough ([id number]) on 01/02/2025 2:19:57 PM    Recent Labs: No results found for requested labs within last 365 days.  Recent Lipid Panel    Component Value Date/Time   CHOL 238 (H) 08/14/2022 1529   TRIG 197 (H) 08/14/2022 1529   HDL 50 08/14/2022 1529   CHOLHDL 4.8 08/14/2022 1529   LDLCALC 152 (H) 08/14/2022 1529     Risk Assessment/Calculations:      HYPERTENSION CONTROL Vitals:   01/02/25 1355  01/02/25 1447  BP: (!) 188/92 (!) 158/88    The patient's blood pressure is elevated above target today.  In order to address the patient's elevated BP: Blood pressure will be monitored at home to determine if medication changes need to be made.; Labs and/or other diagnostics are currently pending prior to making blood pressure medication adjustments.            Physical Exam:    VS:  BP (!) 158/88 Comment: recheck  Pulse 62   Ht 5' 9 (1.753 m)   Wt 159 lb (72.1 kg)   SpO2 99%   BMI 23.48 kg/m     Wt Readings from  Last 3 Encounters:  01/02/25 159 lb (72.1 kg)  02/23/24 165 lb 3.2 oz (74.9 kg)  09/25/23 158 lb 9.6 oz (71.9 kg)     GEN:  Well nourished, well developed in no acute distress HEENT: Normal NECK: No JVD; No carotid bruits LYMPHATICS: No lymphadenopathy CARDIAC: RRR, no murmurs, rubs, gallops RESPIRATORY:  Clear to auscultation without rales, wheezing or rhonchi  ABDOMEN: Soft, non-tender, non-distended MUSCULOSKELETAL:  No edema; No deformity  SKIN: Warm and dry NEUROLOGIC:  Alert and oriented x 3 PSYCHIATRIC:  Normal affect   ASSESSMENT:    1. Preop cardiovascular exam   2. Tobacco abuse   3. Aortic root dilation   4. Primary hypertension   5. Hyperlipidemia with target LDL less than 70    PLAN:    In order of problems listed above:  Nonobstructive CAD - on CCTA - risk factor management - recommend smoking cessation - no chest pain   Hyperlipidemia with LDL goal < 70 - on 20 mg crestor , no updated lipid panel - 8/20263: LDL 152 - will collect lipid panel today   Hypertension - continue amlodipine -olmesartan  10-40 mg daily along with 12.5 mg lopressor  daily - BP significantly elevated, on recheck 158/88 - last BMP with K 5.0 and sCr 1.20 - I will hold off on adding chlorthalidone  without updated labs - obtain BMP today - if WNL, would add 25 mg chlorthalidone  and keep BP log   Moderate aortic root dilation - 43 mm 01/07/23 on CCTA -  smoking cessation recommended.  - CTA chest recommended, no echo in system    Tobacco abuse - has significantly cut back from 2 ppd   Preoperative risk evaluation for MACE prior to back surgery Low risk for MACE, but needs to evaluate aortic root dilation with CTA. He is able to complete greater than 4.0 METS with climbing stairs, can push mow 1 hr and heavy yard work with installing a fence.  Today, his BP is significantly elevated despite medication compliance. I am unable to add a medication without updated labs - will obtain BMP today. If WNL, will add chlorthalidone  with repeat lab in 2 weeks.   He will need CTA aorta to evaluate aortic valve dilation. If stable, may proceed with surgery.    Follow up in 2 months with BP log.       Medication Adjustments/Labs and Tests Ordered: Current medicines are reviewed at length with the patient today.  Concerns regarding medicines are outlined above.  Orders Placed This Encounter  Procedures   CT ANGIO CHEST AORTA W/CM & OR WO/CM   Basic Metabolic Panel (BMET)   Lipid Profile   EKG 12-Lead   Meds ordered this encounter  Medications   metoprolol  succinate (TOPROL  XL) 25 MG 24 hr tablet    Sig: Take 0.5 tablets (12.5 mg total) by mouth daily.    Dispense:  45 tablet    Refill:  3    Patient Instructions  Medication Instructions:  Stop Metoprolol  Tartrate (Lopressor )  Start Metoprolol  Succinate (Toprol  XL) 25 mg take one half tablet (12.5 mg) daily *If you need a refill on your cardiac medications before your next appointment, please call your pharmacy*  Lab Work: Today- BMET, Lipids If you have labs (blood work) drawn today and your tests are completely normal, you will receive your results only by: MyChart Message (if you have MyChart) OR A paper copy in the mail If you have any lab test that is abnormal or we need to  change your treatment, we will call you to review the results.  Testing/Procedures:   Your cardiac CT  will be scheduled at one of the below locations:   [facility] [address]  [place], [place] [address]  IIf scheduled at the [facility] at [employer], please enter the parking lot using the [address] entrance and use the FREE valet service at the patient drop-off area. Enter the building and check-in with registration on the main floor.  Please follow these instructions carefully (unless otherwise directed):  An IV will be required for this test and Nitroglycerin  will be given.  Hold all erectile dysfunction medications at least 3 days (72 hrs) prior to test. (Ie viagra, cialis, sildenafil, tadalafil, etc)   On the Night Before the Test: Be sure to Drink plenty of water. Do not consume any caffeinated/decaffeinated beverages or chocolate 12 hours prior to your test. Do not take any antihistamines 12 hours prior to your test.  If the patient has contrast allergy: Patient will need a prescription for Prednisone and very clear instructions (as follows): Prednisone 50 mg - take 13 hours prior to test Take another Prednisone 50 mg 7 hours prior to test Take another Prednisone 50 mg 1 hour prior to test Take Benadryl 50 mg 1 hour prior to test Patient must complete all four doses of above prophylactic medications. Patient will need a ride after test due to Benadryl.  On the Day of the Test: Drink plenty of water until 1 hour prior to the test. Do not eat any food 1 hour prior to test. You may take your regular medications prior to the test.  Take metoprolol  (Lopressor ) two hours prior to test. If you take Furosemide/Hydrochlorothiazide/Spironolactone/Chlorthalidone , please HOLD on the morning of the test. Patients who wear a continuous glucose monitor MUST remove the device prior to scanning. FEMALES- please wear underwire-free bra if available, avoid dresses & tight clothing       After the Test: Drink plenty of water. After  receiving IV contrast, you may experience a mild flushed feeling. This is normal. On occasion, you may experience a mild rash up to 24 hours after the test. This is not dangerous. If this occurs, you can take Benadryl 25 mg, Zyrtec, Claritin, or Allegra and increase your fluid intake. (Patients taking Tikosyn should avoid Benadryl, and may take Zyrtec, Claritin, or Allegra) If you experience trouble breathing, this can be serious. If it is severe call 911 IMMEDIATELY. If it is mild, please call our office.  We will call to schedule your test 2-4 weeks out understanding that some insurance companies will need an authorization prior to the service being performed.   For more information and frequently asked questions, please visit our website : [email or web address]  For non-scheduling related questions, please contact the cardiac imaging nurse navigator should you have any questions/concerns: Cardiac Imaging Nurse Navigators Direct Office Dial: [phone]   For scheduling needs, including cancellations and rescheduling, please call Brittany, [phone].   Follow-Up: At [facility], you and your health needs are our priority.  As part of our continuing mission to provide you with exceptional heart care, our providers are all part of one team.  This team includes your primary Cardiologist (physician) and Advanced Practice Providers or APPs (Physician Assistants and Nurse Practitioners) who all work together to provide you with the care you need, when you need it.  Your next appointment:   2 month(s)  Provider:  Stanly DELENA Leavens, MD or One of our Advanced Practice Providers (APPs): Morse Clause, PA-C  Lamarr Satterfield, NP Miriam Shams, NP  Olivia Pavy, PA-C Josefa Beauvais, NP  Leontine Salen, PA-C Orren Fabry, PA-C  [place], PA-C Ernest Dick, NP  Damien Braver, NP Jon Hails, PA-C  Waddell Donath, PA-C    Dayna Dunn, PA-C  Scott Weaver, PA-C Lum Louis, NP Katlyn West, NP Callie Goodrich, PA-C  Xika Zhao, NP Sheng Haley, PA-C    Kathleen Johnson, PA-C       We recommend signing up for the patient portal called MyChart.  Sign up information is provided on this After Visit Summary.  MyChart is used to connect with patients for Virtual Visits (Telemedicine).  Patients are able to view lab/test results, encounter notes, upcoming appointments, etc.  Non-urgent messages can be sent to your provider as well.   To learn more about what you can do with MyChart, go to [email or web address].   Other Instructions Monitor your blood pressure and bring readings to your next appointment           Signed, Jon Nat Hails, PA  01/02/2025 2:57 PM    [facility]     [1]  Current Meds  Medication Sig   amLODipine -olmesartan  (AZOR ) 10-40 MG tablet TAKE 1 TABLET BY MOUTH DAILY   gabapentin  (NEURONTIN ) 600 MG tablet Take 600 mg by mouth 3 (three) times daily.   meloxicam  (MOBIC ) 15 MG tablet TAKE 1 TABLET(15 MG) BY MOUTH DAILY   metoprolol  succinate (TOPROL  XL) 25 MG 24 hr tablet Take 0.5 tablets (12.5 mg total) by mouth daily.   nitroGLYCERIN  (NITROSTAT ) 0.4 MG SL tablet PLACE 1 TABLET UNDER THE TONGUE EVERY 5 MINUTES AS NEEDED FOR CHEST PAIN AS DIRECTED   rosuvastatin  (CRESTOR ) 20 MG tablet TAKE 1 TABLET(20 MG) BY MOUTH DAILY   SUBOXONE 8-2 MG FILM Place under the tongue daily.   tiZANidine (ZANAFLEX) 4 MG tablet Take 4 mg by mouth 3 (three) times daily as needed.   Varenicline  Tartrate, Starter, (CHANTIX  STARTING MONTH PAK) 0.5 MG X 11 & 1 MG X 42 TBPK Days 1 to 3: 0.5 mg once daily. Days 4 to 7: 0.5 mg twice daily. Then increase to 1 mg twice daily   [DISCONTINUED] metoprolol  tartrate (LOPRESSOR ) 25 MG tablet TAKE 1/2 TABLET(12.5 MG) BY MOUTH TWICE DAILY AS NEEDED FOR PALPITATIONS   "

## 2025-01-02 ENCOUNTER — Encounter: Payer: Self-pay | Admitting: Physician Assistant

## 2025-01-02 ENCOUNTER — Ambulatory Visit: Payer: MEDICAID | Admitting: Physician Assistant

## 2025-01-02 ENCOUNTER — Other Ambulatory Visit (HOSPITAL_COMMUNITY): Payer: Self-pay

## 2025-01-02 VITALS — BP 158/88 | HR 62 | Ht 69.0 in | Wt 159.0 lb

## 2025-01-02 DIAGNOSIS — Z720 Tobacco use: Secondary | ICD-10-CM | POA: Diagnosis not present

## 2025-01-02 DIAGNOSIS — Z01810 Encounter for preprocedural cardiovascular examination: Secondary | ICD-10-CM

## 2025-01-02 DIAGNOSIS — E785 Hyperlipidemia, unspecified: Secondary | ICD-10-CM

## 2025-01-02 DIAGNOSIS — I10 Essential (primary) hypertension: Secondary | ICD-10-CM | POA: Diagnosis not present

## 2025-01-02 DIAGNOSIS — I77810 Thoracic aortic ectasia: Secondary | ICD-10-CM | POA: Diagnosis not present

## 2025-01-02 MED ORDER — METOPROLOL SUCCINATE ER 25 MG PO TB24: 12.5000 mg | ORAL_TABLET | Freq: Every day | ORAL | 3 refills | Status: AC

## 2025-01-02 MED ORDER — METOPROLOL SUCCINATE ER 25 MG PO TB24
12.5000 mg | ORAL_TABLET | Freq: Every day | ORAL | 3 refills | Status: DC
  Filled 2025-01-02: qty 45, 90d supply, fill #0

## 2025-01-02 NOTE — Patient Instructions (Signed)
 Medication Instructions:  Stop Metoprolol  Tartrate (Lopressor )  Start Metoprolol  Succinate (Toprol  XL) 25 mg take one half tablet (12.5 mg) daily *If you need a refill on your cardiac medications before your next appointment, please call your pharmacy*  Lab Work: Today- BMET, Lipids If you have labs (blood work) drawn today and your tests are completely normal, you will receive your results only by: MyChart Message (if you have MyChart) OR A paper copy in the mail If you have any lab test that is abnormal or we need to change your treatment, we will call you to review the results.  Testing/Procedures:   Your cardiac CT will be scheduled at one of the below locations:   [facility] [address]  [place], [place] [address]  IIf scheduled at the [facility] at [employer], please enter the parking lot using the [address] street entrance and use the FREE valet service at the patient drop-off area. Enter the building and check-in with registration on the main floor.  Please follow these instructions carefully (unless otherwise directed):  An IV will be required for this test and Nitroglycerin  will be given.  Hold all erectile dysfunction medications at least 3 days (72 hrs) prior to test. (Ie viagra, cialis, sildenafil, tadalafil, etc)   On the Night Before the Test: Be sure to Drink plenty of water. Do not consume any caffeinated/decaffeinated beverages or chocolate 12 hours prior to your test. Do not take any antihistamines 12 hours prior to your test.  If the patient has contrast allergy: Patient will need a prescription for Prednisone and very clear instructions (as follows): Prednisone 50 mg - take 13 hours prior to test Take another Prednisone 50 mg 7 hours prior to test Take another Prednisone 50 mg 1 hour prior to test Take Benadryl 50 mg 1 hour prior to test Patient must complete all four doses of above prophylactic  medications. Patient will need a ride after test due to Benadryl.  On the Day of the Test: Drink plenty of water until 1 hour prior to the test. Do not eat any food 1 hour prior to test. You may take your regular medications prior to the test.  Take metoprolol  (Lopressor ) two hours prior to test. If you take Furosemide/Hydrochlorothiazide/Spironolactone/Chlorthalidone , please HOLD on the morning of the test. Patients who wear a continuous glucose monitor MUST remove the device prior to scanning. FEMALES- please wear underwire-free bra if available, avoid dresses & tight clothing       After the Test: Drink plenty of water. After receiving IV contrast, you may experience a mild flushed feeling. This is normal. On occasion, you may experience a mild rash up to 24 hours after the test. This is not dangerous. If this occurs, you can take Benadryl 25 mg, Zyrtec, Claritin, or Allegra and increase your fluid intake. (Patients taking Tikosyn should avoid Benadryl, and may take Zyrtec, Claritin, or Allegra) If you experience trouble breathing, this can be serious. If it is severe call 911 IMMEDIATELY. If it is mild, please call our office.  We will call to schedule your test 2-4 weeks out understanding that some insurance companies will need an authorization prior to the service being performed.   For more information and frequently asked questions, please visit our website : [email or web address]  For non-scheduling related questions, please contact the cardiac imaging nurse navigator should you have any questions/concerns: Cardiac Imaging Nurse Navigators Direct Office Dial: [phone]   For scheduling needs,  including cancellations and rescheduling, please call Brittany, [phone].   Follow-Up: At [facility], you and your health needs are our priority.  As part of our continuing mission to provide you with exceptional heart care, our providers are all part of one  team.  This team includes your primary Cardiologist (physician) and Advanced Practice Providers or APPs (Physician Assistants and Nurse Practitioners) who all work together to provide you with the care you need, when you need it.  Your next appointment:   2 month(s)  Provider:   Stanly DELENA Leavens, MD or One of our Advanced Practice Providers (APPs): Morse Clause, PA-C  Lamarr Satterfield, NP Miriam Shams, NP  Olivia Pavy, PA-C Josefa Beauvais, NP  Leontine Salen, PA-C Orren Fabry, PA-C  Hao Meng, PA-C Ernest Dick, NP  Damien Braver, NP Jon Hails, PA-C  Waddell Donath, PA-C    Dayna Dunn, PA-C  Scott Weaver, PA-C Lum Louis, NP Katlyn West, NP Callie Goodrich, PA-C  Xika Zhao, NP Sheng Haley, PA-C    Kathleen Johnson, PA-C       We recommend signing up for the patient portal called MyChart.  Sign up information is provided on this After Visit Summary.  MyChart is used to connect with patients for Virtual Visits (Telemedicine).  Patients are able to view lab/test results, encounter notes, upcoming appointments, etc.  Non-urgent messages can be sent to your provider as well.   To learn more about what you can do with MyChart, go to [email or web address].   Other Instructions Monitor your blood pressure and bring readings to your next appointment

## 2025-01-02 NOTE — Addendum Note (Signed)
 Addended by: Malayla Granberry S on: 01/02/2025 03:07 PM   Modules accepted: Orders

## 2025-01-03 ENCOUNTER — Ambulatory Visit: Payer: Self-pay | Admitting: Physician Assistant

## 2025-01-03 LAB — BASIC METABOLIC PANEL WITH GFR
BUN/Creatinine Ratio: 5 — ABNORMAL LOW (ref 9–20)
BUN: 5 mg/dL — ABNORMAL LOW (ref 6–24)
CO2: 22 mmol/L (ref 20–29)
Calcium: 10.1 mg/dL (ref 8.7–10.2)
Chloride: 98 mmol/L (ref 96–106)
Creatinine, Ser: 1.05 mg/dL (ref 0.76–1.27)
Glucose: 103 mg/dL — ABNORMAL HIGH (ref 70–99)
Potassium: 4.6 mmol/L (ref 3.5–5.2)
Sodium: 135 mmol/L (ref 134–144)
eGFR: 84 mL/min/1.73

## 2025-01-03 LAB — LIPID PANEL
Chol/HDL Ratio: 3.6 ratio (ref 0.0–5.0)
Cholesterol, Total: 170 mg/dL (ref 100–199)
HDL: 47 mg/dL
LDL Chol Calc (NIH): 97 mg/dL (ref 0–99)
Triglycerides: 149 mg/dL (ref 0–149)
VLDL Cholesterol Cal: 26 mg/dL (ref 5–40)

## 2025-01-03 NOTE — Progress Notes (Signed)
 Surgical Instructions   Your procedure is scheduled on Monday, January 12th, 2026. Report to [facility] Main Entrance A at 7:45 A.M., then check in with the Admitting office. Any questions or running late day of surgery: call [phone]  Questions prior to your surgery date: call [phone], Monday-Friday, 8am-4pm. If you experience any cold or flu symptoms such as cough, fever, chills, shortness of breath, etc. between now and your scheduled surgery, please notify us  at the above number.     Remember:  Do not eat or drink after midnight the night before your surgery    Take these medicines the morning of surgery with A SIP OF WATER: Gabapentin  (Neurontin ) Metoprolol  Succinate (Toprol -XL) Rosuvastatin  (Crestor )   May take these medicines IF NEEDED: Nitroglycerin   - if you need to take this medication, please call [phone] after taking Tizanidine (Zanaflex)   Follow your prescriber's instructions on when to stop Suboxone.    One week prior to surgery, STOP taking any Aspirin (unless otherwise instructed by your surgeon) Aleve, Naproxen, Ibuprofen, Motrin, Advil, Goody's, BC's, all herbal medications, fish oil, and non-prescription vitamins.  This includes your Meloxicam  (Mobic ).                     Do NOT Smoke (Tobacco/Vaping) for 24 hours prior to your procedure.  If you use a CPAP at night, you may bring your mask/headgear for your overnight stay.   You will be asked to remove any contacts, glasses, piercing's, hearing aid's, dentures/partials prior to surgery. Please bring cases for these items if needed.    Patients discharged the day of surgery will not be allowed to drive home, and someone needs to stay with them for 24 hours.  SURGICAL WAITING ROOM VISITATION Patients may have no more than 2 support people in the waiting area - these visitors may rotate.   Pre-op nurse will coordinate an appropriate time for 1 ADULT support person, who may not rotate, to  accompany patient in pre-op.  Children under the age of 78 must have an adult with them who is not the patient and must remain in the main waiting area with an adult.  If the patient needs to stay at the hospital during part of their recovery, the visitor guidelines for inpatient rooms apply.  Please refer to the [facility] website for the visitor guidelines for any additional information.   If you received a COVID test during your pre-op visit  it is requested that you wear a mask when out in public, stay away from anyone that may not be feeling well and notify your surgeon if you develop symptoms. If you have been in contact with anyone that has tested positive in the last 10 days please notify you surgeon.      Pre-operative 4 CHG Bathing Instructions   You can play a key role in reducing the risk of infection after surgery. Your skin needs to be as free of germs as possible. You can reduce the number of germs on your skin by washing with CHG (chlorhexidine  gluconate) soap before surgery. CHG is an antiseptic soap that kills germs and continues to kill germs even after washing.   DO NOT use if you have an allergy to chlorhexidine /CHG or antibacterial soaps. If your skin becomes reddened or irritated, stop using the CHG and notify one of our RNs at [phone].   Please shower with the CHG soap starting 4 days before surgery using the following schedule:  Please keep in mind the following:  DO NOT shave, including legs and underarms, starting the day of your first shower.   You may shave your face at any point before/day of surgery.  Place clean sheets on your bed the day you start using CHG soap. Use a clean washcloth (not used since being washed) for each shower. DO NOT sleep with pets once you start using the CHG.   CHG Shower Instructions:  Wash your face and private area with normal soap. If you choose to wash your hair, wash first with your normal shampoo.  After you use  shampoo/soap, rinse your hair and body thoroughly to remove shampoo/soap residue.  Turn the water OFF and apply  bottle of CHG soap to a CLEAN washcloth.  Apply CHG soap ONLY FROM YOUR NECK DOWN TO YOUR TOES (washing for 3-5 minutes)  DO NOT use CHG soap on face, private areas, open wounds, or sores.  Pay special attention to the area where your surgery is being performed.  If you are having back surgery, having someone wash your back for you may be helpful. Wait 2 minutes after CHG soap is applied, then you may rinse off the CHG soap.  Pat dry with a clean towel  Put on clean clothes/pajamas   If you choose to wear lotion, please use ONLY the CHG-compatible lotions that are listed below.  Additional instructions for the day of surgery:  If you choose, you may shower the morning of surgery with an antibacterial soap.  DO NOT APPLY any lotions, deodorants, cologne, or perfumes.   Do not bring valuables to the hospital. [facility] is not responsible for any belongings/valuables. Do not wear nail polish, gel polish, artificial nails, or any other type of covering on natural nails (fingers and toes) Do not wear jewelry or makeup Put on clean/comfortable clothes.  Please brush your teeth.  Ask your nurse before applying any prescription medications to the skin.     CHG Compatible Lotions   Aveeno Moisturizing lotion  Cetaphil Moisturizing Cream  Cetaphil Moisturizing Lotion  Clairol Herbal Essence Moisturizing Lotion, Dry Skin  Clairol Herbal Essence Moisturizing Lotion, Extra Dry Skin  Clairol Herbal Essence Moisturizing Lotion, Normal Skin  Curel Age Defying Therapeutic Moisturizing Lotion with Alpha Hydroxy  Curel Extreme Care Body Lotion  Curel Soothing Hands Moisturizing Hand Lotion  Curel Therapeutic Moisturizing Cream, Fragrance-Free  Curel Therapeutic Moisturizing Lotion, Fragrance-Free  Curel Therapeutic Moisturizing Lotion, Original Formula  Eucerin Daily Replenishing  Lotion  Eucerin Dry Skin Therapy Plus Alpha Hydroxy Crme  Eucerin Dry Skin Therapy Plus Alpha Hydroxy Lotion  Eucerin Original Crme  Eucerin Original Lotion  Eucerin Plus Crme Eucerin Plus Lotion  Eucerin TriLipid Replenishing Lotion  Keri Anti-Bacterial Hand Lotion  Keri Deep Conditioning Original Lotion Dry Skin Formula Softly Scented  Keri Deep Conditioning Original Lotion, Fragrance Free Sensitive Skin Formula  Keri Lotion Fast Absorbing Fragrance Free Sensitive Skin Formula  Keri Lotion Fast Absorbing Softly Scented Dry Skin Formula  Keri Original Lotion  Keri Skin Renewal Lotion Keri Silky Smooth Lotion  Keri Silky Smooth Sensitive Skin Lotion  Nivea Body Creamy Conditioning Oil  Nivea Body Extra Enriched Lotion  Nivea Body Original Lotion  Nivea Body Sheer Moisturizing Lotion Nivea Crme  Nivea Skin Firming Lotion  NutraDerm 30 Skin Lotion  NutraDerm Skin Lotion  NutraDerm Therapeutic Skin Cream  NutraDerm Therapeutic Skin Lotion  ProShield Protective Hand Cream  Provon moisturizing lotion  Please read over the following fact sheets that  you were given.

## 2025-01-04 ENCOUNTER — Encounter (HOSPITAL_COMMUNITY): Payer: Self-pay

## 2025-01-04 ENCOUNTER — Other Ambulatory Visit: Payer: Self-pay

## 2025-01-04 ENCOUNTER — Encounter (HOSPITAL_COMMUNITY)
Admission: RE | Admit: 2025-01-04 | Discharge: 2025-01-04 | Disposition: A | Discharge status: Home / Self Care | Payer: MEDICAID | Source: Ambulatory Visit | Attending: Neurosurgery | Admitting: Neurosurgery

## 2025-01-04 VITALS — BP 137/78 | HR 58 | Temp 97.9°F | Resp 17 | Ht 69.0 in | Wt 157.0 lb

## 2025-01-04 DIAGNOSIS — M4803 Spinal stenosis, cervicothoracic region: Secondary | ICD-10-CM | POA: Insufficient documentation

## 2025-01-04 DIAGNOSIS — Z01812 Encounter for preprocedural laboratory examination: Secondary | ICD-10-CM | POA: Insufficient documentation

## 2025-01-04 DIAGNOSIS — I77810 Thoracic aortic ectasia: Secondary | ICD-10-CM | POA: Insufficient documentation

## 2025-01-04 DIAGNOSIS — I471 Supraventricular tachycardia, unspecified: Secondary | ICD-10-CM | POA: Insufficient documentation

## 2025-01-04 DIAGNOSIS — Z981 Arthrodesis status: Secondary | ICD-10-CM | POA: Diagnosis not present

## 2025-01-04 DIAGNOSIS — I2510 Atherosclerotic heart disease of native coronary artery without angina pectoris: Secondary | ICD-10-CM | POA: Diagnosis not present

## 2025-01-04 DIAGNOSIS — M5416 Radiculopathy, lumbar region: Secondary | ICD-10-CM | POA: Insufficient documentation

## 2025-01-04 DIAGNOSIS — M48061 Spinal stenosis, lumbar region without neurogenic claudication: Secondary | ICD-10-CM | POA: Insufficient documentation

## 2025-01-04 DIAGNOSIS — Z01818 Encounter for other preprocedural examination: Secondary | ICD-10-CM

## 2025-01-04 HISTORY — DX: Essential (primary) hypertension: I10

## 2025-01-04 HISTORY — DX: Unspecified osteoarthritis, unspecified site: M19.90

## 2025-01-04 LAB — CBC
HCT: 48 % (ref 39.0–52.0)
Hemoglobin: 17 g/dL (ref 13.0–17.0)
MCH: 33.9 pg (ref 26.0–34.0)
MCHC: 35.4 g/dL (ref 30.0–36.0)
MCV: 95.8 fL (ref 80.0–100.0)
Platelets: 319 K/uL (ref 150–400)
RBC: 5.01 MIL/uL (ref 4.22–5.81)
RDW: 14 % (ref 11.5–15.5)
WBC: 10.6 K/uL — ABNORMAL HIGH (ref 4.0–10.5)
nRBC: 0 % (ref 0.0–0.2)

## 2025-01-04 LAB — SURGICAL PCR SCREEN
MRSA, PCR: NEGATIVE
Staphylococcus aureus: POSITIVE — AB

## 2025-01-04 MED ORDER — CHLORTHALIDONE 25 MG PO TABS: 12.5000 mg | ORAL_TABLET | Freq: Every day | ORAL | 3 refills | Status: AC

## 2025-01-04 NOTE — Telephone Encounter (Signed)
 Yes, I would like for him to take both 12.5 mg toprol  at night and 12.5 mg chlorthalidone  in the morning.

## 2025-01-04 NOTE — Progress Notes (Signed)
 Surgical Instructions   Your procedure is scheduled on Monday, January 12th, 2026. Report to [facility] Main Entrance A at 7:45 A.M., then check in with the Admitting office. Any questions or running late day of surgery: call [phone]  Questions prior to your surgery date: call [phone], Monday-Friday, 8am-4pm. If you experience any cold or flu symptoms such as cough, fever, chills, shortness of breath, etc. between now and your scheduled surgery, please notify us  at the above number.     Remember:  Do not eat or drink after midnight the night before your surgery    Take these medicines the morning of surgery with A SIP OF WATER: Gabapentin  (Neurontin ) Metoprolol  Succinate (Toprol -XL) Rosuvastatin  (Crestor )   May take these medicines IF NEEDED: Nitroglycerin   - if you need to take this medication, please call [phone] after taking Tizanidine (Zanaflex) Acetaminophen  Tums  Follow your prescriber's instructions on when to stop Suboxone.    One week prior to surgery, STOP taking any Aspirin (unless otherwise instructed by your surgeon) Aleve, Naproxen, Ibuprofen, Motrin, Advil, Goody's, BC's, all herbal medications, fish oil, and non-prescription vitamins.  This includes your Meloxicam  (Mobic ).                     Do NOT Smoke (Tobacco/Vaping) for 24 hours prior to your procedure.  If you use a CPAP at night, you may bring your mask/headgear for your overnight stay.   You will be asked to remove any contacts, glasses, piercing's, hearing aid's, dentures/partials prior to surgery. Please bring cases for these items if needed.    Patients discharged the day of surgery will not be allowed to drive home, and someone needs to stay with them for 24 hours.  SURGICAL WAITING ROOM VISITATION Patients may have no more than 2 support people in the waiting area - these visitors may rotate.   Pre-op nurse will coordinate an appropriate time for 1 ADULT support person, who may  not rotate, to accompany patient in pre-op.  Children under the age of 44 must have an adult with them who is not the patient and must remain in the main waiting area with an adult.  If the patient needs to stay at the hospital during part of their recovery, the visitor guidelines for inpatient rooms apply.  Please refer to the [facility] website for the visitor guidelines for any additional information.   If you received a COVID test during your pre-op visit  it is requested that you wear a mask when out in public, stay away from anyone that may not be feeling well and notify your surgeon if you develop symptoms. If you have been in contact with anyone that has tested positive in the last 10 days please notify you surgeon.      Pre-operative 4 CHG Bathing Instructions   You can play a key role in reducing the risk of infection after surgery. Your skin needs to be as free of germs as possible. You can reduce the number of germs on your skin by washing with CHG (chlorhexidine  gluconate) soap before surgery. CHG is an antiseptic soap that kills germs and continues to kill germs even after washing.   DO NOT use if you have an allergy to chlorhexidine /CHG or antibacterial soaps. If your skin becomes reddened or irritated, stop using the CHG and notify one of our RNs at [phone].   Please shower with the CHG soap starting 4 days before surgery using the following schedule:  Please keep in mind the following:  DO NOT shave, including legs and underarms, starting the day of your first shower.   You may shave your face at any point before/day of surgery.  Place clean sheets on your bed the day you start using CHG soap. Use a clean washcloth (not used since being washed) for each shower. DO NOT sleep with pets once you start using the CHG.   CHG Shower Instructions:  Wash your face and private area with normal soap. If you choose to wash your hair, wash first with your normal shampoo.   After you use shampoo/soap, rinse your hair and body thoroughly to remove shampoo/soap residue.  Turn the water OFF and apply  bottle of CHG soap to a CLEAN washcloth.  Apply CHG soap ONLY FROM YOUR NECK DOWN TO YOUR TOES (washing for 3-5 minutes)  DO NOT use CHG soap on face, private areas, open wounds, or sores.  Pay special attention to the area where your surgery is being performed.  If you are having back surgery, having someone wash your back for you may be helpful. Wait 2 minutes after CHG soap is applied, then you may rinse off the CHG soap.  Pat dry with a clean towel  Put on clean clothes/pajamas   If you choose to wear lotion, please use ONLY the CHG-compatible lotions that are listed below.  Additional instructions for the day of surgery:  If you choose, you may shower the morning of surgery with an antibacterial soap.  DO NOT APPLY any lotions, deodorants, cologne, or perfumes.   Do not bring valuables to the hospital. [facility] is not responsible for any belongings/valuables. Do not wear nail polish, gel polish, artificial nails, or any other type of covering on natural nails (fingers and toes) Do not wear jewelry or makeup Put on clean/comfortable clothes.  Please brush your teeth.  Ask your nurse before applying any prescription medications to the skin.     CHG Compatible Lotions   Aveeno Moisturizing lotion  Cetaphil Moisturizing Cream  Cetaphil Moisturizing Lotion  Clairol Herbal Essence Moisturizing Lotion, Dry Skin  Clairol Herbal Essence Moisturizing Lotion, Extra Dry Skin  Clairol Herbal Essence Moisturizing Lotion, Normal Skin  Curel Age Defying Therapeutic Moisturizing Lotion with Alpha Hydroxy  Curel Extreme Care Body Lotion  Curel Soothing Hands Moisturizing Hand Lotion  Curel Therapeutic Moisturizing Cream, Fragrance-Free  Curel Therapeutic Moisturizing Lotion, Fragrance-Free  Curel Therapeutic Moisturizing Lotion, Original Formula  Eucerin Daily  Replenishing Lotion  Eucerin Dry Skin Therapy Plus Alpha Hydroxy Crme  Eucerin Dry Skin Therapy Plus Alpha Hydroxy Lotion  Eucerin Original Crme  Eucerin Original Lotion  Eucerin Plus Crme Eucerin Plus Lotion  Eucerin TriLipid Replenishing Lotion  Keri Anti-Bacterial Hand Lotion  Keri Deep Conditioning Original Lotion Dry Skin Formula Softly Scented  Keri Deep Conditioning Original Lotion, Fragrance Free Sensitive Skin Formula  Keri Lotion Fast Absorbing Fragrance Free Sensitive Skin Formula  Keri Lotion Fast Absorbing Softly Scented Dry Skin Formula  Keri Original Lotion  Keri Skin Renewal Lotion Keri Silky Smooth Lotion  Keri Silky Smooth Sensitive Skin Lotion  Nivea Body Creamy Conditioning Oil  Nivea Body Extra Enriched Lotion  Nivea Body Original Lotion  Nivea Body Sheer Moisturizing Lotion Nivea Crme  Nivea Skin Firming Lotion  NutraDerm 30 Skin Lotion  NutraDerm Skin Lotion  NutraDerm Therapeutic Skin Cream  NutraDerm Therapeutic Skin Lotion  ProShield Protective Hand Cream  Provon moisturizing lotion  Please read over the following fact sheets that  you were given.

## 2025-01-05 ENCOUNTER — Ambulatory Visit (HOSPITAL_COMMUNITY)
Admission: RE | Admit: 2025-01-05 | Discharge: 2025-01-05 | Disposition: A | Discharge status: Home / Self Care | Payer: MEDICAID | Source: Ambulatory Visit | Attending: Physician Assistant | Admitting: Physician Assistant

## 2025-01-05 DIAGNOSIS — I77810 Thoracic aortic ectasia: Secondary | ICD-10-CM | POA: Diagnosis present

## 2025-01-05 MED ORDER — IOHEXOL 350 MG/ML SOLN
75.0000 mL | Freq: Once | INTRAVENOUS | Status: AC | PRN
  Administered 2025-01-05: 75 mL via INTRAVENOUS

## 2025-01-05 NOTE — Progress Notes (Addendum)
 Anesthesia Chart Review:  Case: [id number] Date/Time: 01/09/25 0939   Procedure: LUMBAR LAMINECTOMY/DECOMPRESSION MICRODISCECTOMY 1 LEVEL (Right: Back) - Laminectomy and Foraminotomy - right - L4-L5 with sublaminar decompression   Anesthesia type: General   Diagnosis: Lumbar radiculopathy [M54.16]   Pre-op diagnosis: Lumbar radiculopathy   [facility] ROOM [facility] / [facility]   Surgeons: Onetha Kuba, MD       DISCUSSION: Patient is a 54 year old Rick [name] scheduled for the above procedure.  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   Last cardiology evaluation was on 01/02/2025 by Madie Slough, PA for follow-up and preoperative evaluation. Initially seen by Dr. Lorrane on 10/26/2020 for long history of palpitations. Paroxysmal SVT suspected with resolution after vagal-type maneuvers. No syncope, near syncope, chest pain, dyspnea. Good exercise tolerance then. 10/2020 Zio patch showed rare symptomatic PACs. Metoprolol  as needed added. In 102023 she reported chest pain and had a CCTA 12/2022 that showed minimal nonobstructive CAD. Aortic root dilated at 43 mm. On Crestor . A repeat Zio monitor done in 11/2023 due to increasing palpitations and showed runs of symptomatic SVT. Bisoprolol 2.5 mg daily added--now on Toprol  XL 12.5 mg daily.  Also on Azor  for BP. Chlorthalidone  added 01/04/2025 for better BP control.   Regarding preoperative CV risk evaluation: Low risk for MACE, but needs to evaluate aortic root dilation with CTA. He is able to complete greater than 4.0 METS with climbing stairs, can push mow 1 hr and heavy yard work with installing a fence.  Today, his BP is significantly elevated despite medication compliance. I am unable to add a medication without updated labs - will obtain  BMP today. If WNL, will add chlorthalidone  with repeat lab in 2 weeks.    He will need CTA aorta to evaluate aortic valve dilation. If stable, may proceed with surgery. As above chlorthalidone  added since Cr 1.05. He is getting a CTA chest/aorta on 01/05/2025.    He is on Suboxone  SL for history of opioid dependence.    Will leave chart for follow-up CTA results. (UPDATE 01/06/2025 5:17 PM: 01/05/2025 CTA chest showed stable aortic root measuring 4.3 cm with one year follow-up recommended.)    VS: BP 137/78   Pulse (!) 58   Temp Rick.6 C   Resp 17   Ht 5' 9 (1.753 m)   Wt 71.2 kg   SpO2 100%   BMI 23.18 kg/m    PROVIDERS: Romelle Booty, MD is PCP  Santo Kelly, MD is cardiologist    LABS: Preoperative labs from 01/02/2025 and 01/04/2025 noted. Results include: Lab Results  Component Value Date   WBC 10.6 (H) 01/04/2025   HGB 17.0 01/04/2025   HCT 48.0 01/04/2025   PLT 319 01/04/2025   GLUCOSE 103 (H) 01/02/2025   CHOL 170 01/02/2025   TRIG 149 01/02/2025   HDL 47 01/02/2025   LDLCALC 97 01/02/2025   NA 135 01/02/2025   K 4.6 01/02/2025   CL 98 01/02/2025   CREATININE 1.05 01/02/2025   BUN 5 (L) 01/02/2025   CO2 22 01/02/2025    IMAGES: CTA Chest/Aorta 01/05/2025: IMPRESSION: 1. Unchanged dilated aortic root measuring up to 4.3 cm. Recommend follow up CTA and/or MRA in 1 year for further evaluation.  MRI L-spine 04/08/2024: IMPRESSION: 1. Prior laminotomy and microdiscectomy at L2-L3. No recurrent disc herniation at this level. 2. Severe right  and moderate left neuroforaminal stenosis at L4-L5. 3. Severe bilateral neuroforaminal stenosis at L5-S1. 4. Moderate bilateral neuroforaminal stenosis at L3-L4.  MRI C-spine 04/08/2024: IMPRESSION: 1. Anterior fusion from C4 through C7. 2. Moderate canal stenosis at C3-C4 secondary to disc osteophyte complex. 3. Severe foraminal stenoses bilaterally at C3-C4 and C7-T1 secondary to uncovertebral joint  disease.   EKG:01/02/2025: Normal sinus rhythm at 62 bpm   CV: Zio long term monitor 12/21/2023 - 01/02/2024:   Patient had a minimum heart rate of 42 bpm, maximum heart rate of 182 bpm, and average heart rate of 72 bpm.  Predominant underlying rhythm was sinus rhythm.  Symptomatic SVT- rates variable but longest episodes 119 bpm.  Isolated PACs were rare (<1.0%).  Isolated PVCs were rare (<1.0%).  Triggered and diary events associated with sinus rhythm and SVT.   Symptomatic paroxysmal SVT.    CTA Coronary 01/07/2023: IMPRESSION: 1. Coronary calcium  score of 120. This was 86th percentile for age-, sex, and race-matched controls. 2. Normal coronary origin with right dominance. 3. Minimal plaque (<25%) in the left main, LCX, and RCA. 4. Moderate aortic root dilation up to 43 mm. RECOMMENDATIONS: 1. Minimal non-obstructive CAD (0-24%). Consider non-atherosclerotic causes of chest pain. Consider preventive therapy and risk factor modification.   Past Medical History:  Diagnosis Date   Arthritis    Back pain    BOILS, RECURRENT 04/13/2007   Qualifier: Diagnosis of  By: Jeanelle MD, [place]     Depression    GASTROESOPHAGEAL REFLUX, NO ESOPHAGITIS 02/25/2007   Qualifier: Diagnosis of  By: Damien Folks     Hypertension    MIGRAINE, UNSPEC., W/O INTRACTABLE MIGRAINE 02/25/2007   Qualifier: Diagnosis of  By: Damien Folks     Neck pain    OSTEOARTHRITIS OF SPINE, NOS 02/25/2007   Qualifier: Diagnosis of  By: Damien Folks     PAC (premature atrial contraction) 01/28/2021   Palpitations 10/02/2020    Past Surgical History:  Procedure Laterality Date   LUMBAR LAMINECTOMY/DECOMPRESSION MICRODISCECTOMY Right 01/03/2022   Procedure: Microdiscectomy - L2-L3 - right;  Surgeon: Onetha Kuba, MD;  Location: [facility] OR;  Service: Neurosurgery;  Laterality: Right;  3C   Meniscus Right 2023   repair   NECK SURGERY     x 3   TONSILLECTOMY Bilateral 1976    MEDICATIONS:   acetaminophen  (TYLENOL ) 500 MG tablet   amLODipine -olmesartan  (AZOR ) 10-40 MG tablet   calcium  carbonate (TUMS - DOSED IN MG ELEMENTAL CALCIUM ) 500 MG chewable tablet   chlorthalidone  (HYGROTON ) 25 MG tablet   gabapentin  (NEURONTIN ) 800 MG tablet   meloxicam  (MOBIC ) 15 MG tablet   metoprolol  succinate (TOPROL  XL) 25 MG 24 hr tablet   nitroGLYCERIN  (NITROSTAT ) 0.4 MG SL tablet   rosuvastatin  (CRESTOR ) 20 MG tablet   SUBOXONE  8-2 MG FILM   tiZANidine  (ZANAFLEX ) 4 MG tablet   No current facility-administered medications for this encounter.   Isaiah Ruder, PA-C Surgical Short Stay/Anesthesiology Center [facility] Phone [phone] [facility] Phone [phone] 01/05/2025 1:13 PM

## 2025-01-05 NOTE — Progress Notes (Signed)
 PCP - Atif Mahmood,MD Cardiologist - Mahesh Chandrasekhar,MD- LOV 01/02/25  PPM/ICD - denies Device Orders -  Rep Notified -   Chest x-ray - na EKG - 01/02/25 Stress Test - denies ECHO - denies Cardiac Cath - denies  Sleep Study - na CPAP -   Fasting Blood Sugar - na Checks Blood Sugar _____ times a day  Last dose of GLP1 agonist-  na GLP1 instructions:   Blood Thinner Instructions:NA Aspirin Instructions:na  ERAS Protcol -  NPO PRE-SURGERY Ensure or G2-   COVID TEST- NA   Anesthesia review: yes- Cardiac clearance pending  CTA aorta scheduled for 01/05/25.  Patient denies shortness of breath, fever, cough and chest pain at PAT appointment   All instructions explained to the patient, with a verbal understanding of the material. Patient agrees to go over the instructions while at home for a better understanding.The opportunity to ask questions was provided.

## 2025-01-06 ENCOUNTER — Other Ambulatory Visit (HOSPITAL_COMMUNITY): Payer: MEDICAID

## 2025-01-06 NOTE — Anesthesia Preprocedure Evaluation (Signed)
 "                                  Anesthesia Evaluation  Patient identified by MRN, date of birth, ID band Patient awake    Reviewed: Allergy & Precautions, NPO status , Patient's Chart, lab work & pertinent test results  Airway Mallampati: II  TM Distance: >3 FB Neck ROM: Limited    Dental  (+) Dental Advisory Given, Lower Dentures, Upper Dentures,  VERY SMALL MOUTH:   Pulmonary Patient abstained from smoking., former smoker    + wheezing      Cardiovascular hypertension, negative cardio ROS Normal cardiovascular exam Rhythm:Regular Rate:Normal     Neuro/Psych  Headaches PSYCHIATRIC DISORDERS Anxiety Depression    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  Reproductive/Obstetrics                              Anesthesia Physical Anesthesia Plan  ASA: 3  Anesthesia Plan: General   Post-op Pain Management: Ofirmev  IV (intra-op)*, Gabapentin  PO (pre-op)*, Dilaudid  IV and Ketamine IV*   Induction: Intravenous  PONV Risk Score and Plan: 2 and Midazolam , Dexamethasone  and Ondansetron   Airway Management Planned: Oral ETT and Video Laryngoscope Planned  Additional Equipment: None  Intra-op Plan:   Post-operative Plan: Extubation in OR  Informed Consent: I have reviewed the patients History and Physical, chart, labs and discussed the procedure including the risks, benefits and alternatives for the proposed anesthesia with the patient or authorized representative who has indicated his/her understanding and acceptance.     Dental advisory given  Plan Discussed with: CRNA and Anesthesiologist  Anesthesia Plan Comments:  (PAT note written 01/05/2025 by Isaiah Ruder, PA-C.     DISCUSSION: Patient is a 54 year old male scheduled for the above procedure.  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   Last cardiology evaluation was on 01/02/2025 by Madie Slough, PA for follow-up and preoperative evaluation. Initially seen by Dr. Lorrane on 10/26/2020 for long history of palpitations. Paroxysmal SVT suspected with resolution after vagal-type maneuvers. No syncope, near syncope, chest pain, dyspnea. Good exercise tolerance then. 10/2020 Zio patch showed rare symptomatic PACs. Metoprolol  as needed added. In 102023 she reported chest pain and had a CCTA 12/2022 that showed minimal nonobstructive CAD. Aortic root dilated at 43 mm. On Crestor . A repeat Zio monitor done in 11/2023 due to increasing palpitations and showed runs of symptomatic SVT. Bisoprolol 2.5 mg daily added--now on Toprol  XL 12.5 mg daily.  Also on Azor  for BP. Chlorthalidone  added 01/04/2025 for better BP control.    Regarding preoperative CV risk evaluation: Low risk for MACE, but needs to evaluate aortic root dilation with CTA. He is able to complete greater than 4.0 METS with climbing stairs, can push mow 1 hr and heavy yard work with installing a fence.  Today, his BP is significantly elevated despite medication compliance. I am  unable to add a medication without updated labs - will obtain BMP today. If WNL, will add chlorthalidone  with repeat lab in 2 weeks.    He will need CTA aorta to evaluate aortic valve dilation. If stable, may proceed with surgery. As above chlorthalidone  added since Cr 1.05. He is getting a CTA chest/aorta on 01/05/2025.    He is on Suboxone  SL for history of opioid dependence.    EKG:01/02/2025: Normal sinus  rhythm at 62 bpm     CV: Zio long term monitor 12/21/2023 - 01/02/2024:   Patient had a minimum heart rate of 42 bpm, maximum heart rate of 182 bpm, and average heart rate of 72 bpm.  Predominant underlying rhythm was sinus rhythm.  Symptomatic SVT- rates variable but longest episodes 119 bpm.  Isolated PACs were rare (<1.0%).  Isolated PVCs were rare (<1.0%).  Triggered and diary events associated with sinus rhythm and SVT.   Symptomatic paroxysmal SVT.     CTA Coronary 01/07/2023: IMPRESSION: 1. Coronary calcium  score of 120. This was 86th percentile for age-, sex, and race-matched controls. 2. Normal coronary origin with right dominance. 3. Minimal plaque (<25%) in the left main, LCX, and RCA. 4. Moderate aortic root dilation up to 43 mm. RECOMMENDATIONS: 1. Minimal non-obstructive CAD (0-24%). Consider non-atherosclerotic causes of chest pain. Consider preventive therapy and risk factor modification.    )         Anesthesia Quick Evaluation  "

## 2025-01-09 ENCOUNTER — Encounter (HOSPITAL_COMMUNITY): Payer: MEDICAID | Admitting: Vascular Surgery

## 2025-01-09 ENCOUNTER — Encounter (HOSPITAL_COMMUNITY)
Admission: RE | Disposition: A | Discharge status: Home / Self Care | Payer: Self-pay | Source: Home / Self Care | Attending: Neurosurgery

## 2025-01-09 ENCOUNTER — Ambulatory Visit (HOSPITAL_COMMUNITY): Payer: MEDICAID

## 2025-01-09 ENCOUNTER — Encounter (HOSPITAL_COMMUNITY): Payer: Self-pay | Admitting: Neurosurgery

## 2025-01-09 ENCOUNTER — Ambulatory Visit (HOSPITAL_COMMUNITY)
Admission: RE | Admit: 2025-01-09 | Discharge: 2025-01-09 | Disposition: A | Payer: MEDICAID | Attending: Neurosurgery | Admitting: Neurosurgery

## 2025-01-09 ENCOUNTER — Other Ambulatory Visit: Payer: Self-pay

## 2025-01-09 ENCOUNTER — Ambulatory Visit (HOSPITAL_COMMUNITY): Payer: MEDICAID | Admitting: Anesthesiology

## 2025-01-09 DIAGNOSIS — I10 Essential (primary) hypertension: Secondary | ICD-10-CM | POA: Insufficient documentation

## 2025-01-09 DIAGNOSIS — M5416 Radiculopathy, lumbar region: Secondary | ICD-10-CM | POA: Insufficient documentation

## 2025-01-09 DIAGNOSIS — Z87891 Personal history of nicotine dependence: Secondary | ICD-10-CM | POA: Insufficient documentation

## 2025-01-09 DIAGNOSIS — M48061 Spinal stenosis, lumbar region without neurogenic claudication: Secondary | ICD-10-CM | POA: Diagnosis present

## 2025-01-09 DIAGNOSIS — F418 Other specified anxiety disorders: Secondary | ICD-10-CM

## 2025-01-09 DIAGNOSIS — M2578 Osteophyte, vertebrae: Secondary | ICD-10-CM | POA: Insufficient documentation

## 2025-01-09 HISTORY — PX: LUMBAR LAMINECTOMY/DECOMPRESSION MICRODISCECTOMY: SHX5026

## 2025-01-09 SURGERY — LUMBAR LAMINECTOMY/DECOMPRESSION MICRODISCECTOMY 1 LEVEL
Anesthesia: General | Site: Back | Laterality: Right

## 2025-01-09 MED ORDER — SUGAMMADEX SODIUM 200 MG/2ML IV SOLN
INTRAVENOUS | Status: DC | PRN
  Administered 2025-01-09: 200 mg via INTRAVENOUS

## 2025-01-09 MED ORDER — MUPIROCIN 2 % EX OINT: 1.0000 | TOPICAL_OINTMENT | Freq: Two times a day (BID) | CUTANEOUS | 0 refills | Status: AC

## 2025-01-09 MED ORDER — LIDOCAINE-EPINEPHRINE 1 %-1:100000 IJ SOLN
INTRAMUSCULAR | Status: AC
  Filled 2025-01-09: qty 1

## 2025-01-09 MED ORDER — BUPRENORPHINE HCL-NALOXONE HCL 2-0.5 MG SL SUBL
1.0000 | SUBLINGUAL_TABLET | Freq: Every day | SUBLINGUAL | Status: DC
  Administered 2025-01-09: 1 via SUBLINGUAL
  Filled 2025-01-09: qty 1

## 2025-01-09 MED ORDER — OXYCODONE HCL 5 MG/5ML PO SOLN: 5.0000 mg | Freq: Once | ORAL | Status: DC | PRN

## 2025-01-09 MED ORDER — PANTOPRAZOLE SODIUM 40 MG IV SOLR: 40.0000 mg | Freq: Every day | INTRAVENOUS | Status: DC

## 2025-01-09 MED ORDER — ALUM & MAG HYDROXIDE-SIMETH 200-200-20 MG/5ML PO SUSP: 30.0000 mL | Freq: Four times a day (QID) | ORAL | Status: DC | PRN

## 2025-01-09 MED ORDER — CHLORHEXIDINE GLUCONATE 0.12 % MT SOLN
15.0000 mL | Freq: Once | OROMUCOSAL | Status: AC
  Administered 2025-01-09: 15 mL via OROMUCOSAL
  Filled 2025-01-09: qty 15

## 2025-01-09 MED ORDER — NITROGLYCERIN 0.4 MG SL SUBL: 0.4000 mg | SUBLINGUAL_TABLET | SUBLINGUAL | Status: DC | PRN

## 2025-01-09 MED ORDER — OXYCODONE HCL 5 MG PO TABS
10.0000 mg | ORAL_TABLET | ORAL | Status: DC | PRN
  Administered 2025-01-09: 10 mg via ORAL
  Filled 2025-01-09: qty 2

## 2025-01-09 MED ORDER — GABAPENTIN 300 MG PO CAPS
900.0000 mg | ORAL_CAPSULE | ORAL | Status: AC
  Administered 2025-01-09: 900 mg via ORAL
  Filled 2025-01-09: qty 3

## 2025-01-09 MED ORDER — FENTANYL CITRATE (PF) 250 MCG/5ML IJ SOLN
INTRAMUSCULAR | Status: DC | PRN
  Administered 2025-01-09 (×2): 50 ug via INTRAVENOUS

## 2025-01-09 MED ORDER — ONDANSETRON HCL 4 MG PO TABS: 4.0000 mg | ORAL_TABLET | Freq: Four times a day (QID) | ORAL | Status: DC | PRN

## 2025-01-09 MED ORDER — METOPROLOL SUCCINATE ER 25 MG PO TB24: 12.5000 mg | ORAL_TABLET | Freq: Once | ORAL | Status: AC

## 2025-01-09 MED ORDER — SODIUM CHLORIDE 0.9% FLUSH: 3.0000 mL | INTRAVENOUS | Status: DC | PRN

## 2025-01-09 MED ORDER — ACETAMINOPHEN 500 MG PO TABS: 1000.0000 mg | ORAL_TABLET | Freq: Four times a day (QID) | ORAL | Status: DC | PRN

## 2025-01-09 MED ORDER — METOPROLOL SUCCINATE ER 25 MG PO TB24: 12.5000 mg | ORAL_TABLET | Freq: Every day | ORAL | Status: DC

## 2025-01-09 MED ORDER — DEXAMETHASONE SOD PHOSPHATE PF 10 MG/ML IJ SOLN
INTRAMUSCULAR | Status: DC | PRN
  Administered 2025-01-09: 5 mg via INTRAVENOUS

## 2025-01-09 MED ORDER — NAPROXEN 250 MG PO TABS: 250.0000 mg | ORAL_TABLET | Freq: Three times a day (TID) | ORAL | Status: DC

## 2025-01-09 MED ORDER — TIZANIDINE HCL 4 MG PO TABS
4.0000 mg | ORAL_TABLET | Freq: Four times a day (QID) | ORAL | Status: DC | PRN
  Filled 2025-01-09: qty 1

## 2025-01-09 MED ORDER — 0.9 % SODIUM CHLORIDE (POUR BTL) OPTIME
TOPICAL | Status: DC | PRN
  Administered 2025-01-09: 1000 mL

## 2025-01-09 MED ORDER — TIZANIDINE HCL 4 MG PO TABS: 4.0000 mg | ORAL_TABLET | Freq: Four times a day (QID) | ORAL | 0 refills | Status: AC | PRN

## 2025-01-09 MED ORDER — ONDANSETRON HCL 4 MG/2ML IJ SOLN: 4.0000 mg | Freq: Four times a day (QID) | INTRAMUSCULAR | Status: DC | PRN

## 2025-01-09 MED ORDER — DEXMEDETOMIDINE HCL IN NACL 80 MCG/20ML IV SOLN
INTRAVENOUS | Status: DC | PRN
  Administered 2025-01-09 (×2): 8 ug via INTRAVENOUS

## 2025-01-09 MED ORDER — LIDOCAINE-EPINEPHRINE 1 %-1:100000 IJ SOLN
INTRAMUSCULAR | Status: DC | PRN
  Administered 2025-01-09: 10 mL

## 2025-01-09 MED ORDER — ONDANSETRON HCL 4 MG/2ML IJ SOLN: 4.0000 mg | Freq: Once | INTRAMUSCULAR | Status: DC | PRN

## 2025-01-09 MED ORDER — PHENYLEPHRINE HCL-NACL 20-0.9 MG/250ML-% IV SOLN
INTRAVENOUS | Status: DC | PRN
  Administered 2025-01-09: 20 ug/min via INTRAVENOUS

## 2025-01-09 MED ORDER — CHLORTHALIDONE 25 MG PO TABS
12.5000 mg | ORAL_TABLET | Freq: Every day | ORAL | Status: DC
  Administered 2025-01-09: 12.5 mg via ORAL
  Filled 2025-01-09: qty 1

## 2025-01-09 MED ORDER — ROSUVASTATIN CALCIUM 20 MG PO TABS: 20.0000 mg | ORAL_TABLET | Freq: Every day | ORAL | Status: DC

## 2025-01-09 MED ORDER — AMLODIPINE-OLMESARTAN 10-40 MG PO TABS: 1.0000 | ORAL_TABLET | Freq: Every day | ORAL | Status: DC

## 2025-01-09 MED ORDER — CHLORHEXIDINE GLUCONATE CLOTH 2 % EX PADS: 6.0000 | MEDICATED_PAD | Freq: Once | CUTANEOUS | Status: DC

## 2025-01-09 MED ORDER — METOPROLOL SUCCINATE ER 25 MG PO TB24
ORAL_TABLET | ORAL | Status: AC
  Filled 2025-01-09: qty 1

## 2025-01-09 MED ORDER — HYDROMORPHONE HCL 1 MG/ML IJ SOLN
INTRAMUSCULAR | Status: AC
  Filled 2025-01-09: qty 0.5

## 2025-01-09 MED ORDER — ROCURONIUM BROMIDE 10 MG/ML (PF) SYRINGE
PREFILLED_SYRINGE | INTRAVENOUS | Status: DC | PRN
  Administered 2025-01-09: 70 mg via INTRAVENOUS

## 2025-01-09 MED ORDER — LIDOCAINE 2% (20 MG/ML) 5 ML SYRINGE
INTRAMUSCULAR | Status: DC | PRN
  Administered 2025-01-09: 80 mg via INTRAVENOUS

## 2025-01-09 MED ORDER — MENTHOL 3 MG MT LOZG: 1.0000 | LOZENGE | OROMUCOSAL | Status: DC | PRN

## 2025-01-09 MED ORDER — OXYCODONE HCL 5 MG PO TABS: 5.0000 mg | ORAL_TABLET | Freq: Once | ORAL | Status: DC | PRN

## 2025-01-09 MED ORDER — AMLODIPINE BESYLATE 10 MG PO TABS
10.0000 mg | ORAL_TABLET | Freq: Every day | ORAL | Status: DC
  Administered 2025-01-09: 10 mg via ORAL
  Filled 2025-01-09: qty 1

## 2025-01-09 MED ORDER — THROMBIN (RECOMBINANT) 5000 UNITS EX SOLR
CUTANEOUS | Status: DC | PRN
  Administered 2025-01-09: 10 mL via TOPICAL

## 2025-01-09 MED ORDER — FENTANYL CITRATE (PF) 100 MCG/2ML IJ SOLN
INTRAMUSCULAR | Status: AC
  Filled 2025-01-09: qty 2

## 2025-01-09 MED ORDER — IRBESARTAN 150 MG PO TABS
300.0000 mg | ORAL_TABLET | Freq: Every day | ORAL | Status: DC
  Administered 2025-01-09: 300 mg via ORAL
  Filled 2025-01-09: qty 2

## 2025-01-09 MED ORDER — ACETAMINOPHEN 650 MG RE SUPP: 650.0000 mg | RECTAL | Status: DC | PRN

## 2025-01-09 MED ORDER — HYDROCODONE-ACETAMINOPHEN 5-325 MG PO TABS: 1.0000 | ORAL_TABLET | Freq: Four times a day (QID) | ORAL | 0 refills | Status: AC | PRN

## 2025-01-09 MED ORDER — CEFAZOLIN SODIUM-DEXTROSE 2-4 GM/100ML-% IV SOLN
2.0000 g | Freq: Three times a day (TID) | INTRAVENOUS | Status: DC
  Administered 2025-01-09: 2 g via INTRAVENOUS
  Filled 2025-01-09: qty 100

## 2025-01-09 MED ORDER — HYDROMORPHONE HCL 1 MG/ML IJ SOLN
INTRAMUSCULAR | Status: DC | PRN
  Administered 2025-01-09 (×3): .5 mg via INTRAVENOUS

## 2025-01-09 MED ORDER — PHENOL 1.4 % MT LIQD: 1.0000 | OROMUCOSAL | Status: DC | PRN

## 2025-01-09 MED ORDER — BUPIVACAINE HCL (PF) 0.25 % IJ SOLN
INTRAMUSCULAR | Status: AC
  Filled 2025-01-09: qty 30

## 2025-01-09 MED ORDER — CEFAZOLIN SODIUM-DEXTROSE 2-4 GM/100ML-% IV SOLN
2.0000 g | INTRAVENOUS | Status: AC
  Administered 2025-01-09: 2 g via INTRAVENOUS
  Filled 2025-01-09: qty 100

## 2025-01-09 MED ORDER — MEPERIDINE HCL 25 MG/ML IJ SOLN: 6.2500 mg | INTRAMUSCULAR | Status: DC | PRN

## 2025-01-09 MED ORDER — BUPIVACAINE HCL (PF) 0.25 % IJ SOLN
INTRAMUSCULAR | Status: DC | PRN
  Administered 2025-01-09: 10 mL

## 2025-01-09 MED ORDER — ACETAMINOPHEN 500 MG PO TABS
1000.0000 mg | ORAL_TABLET | Freq: Once | ORAL | Status: AC
  Administered 2025-01-09: 1000 mg via ORAL
  Filled 2025-01-09: qty 2

## 2025-01-09 MED ORDER — THROMBIN 5000 UNITS EX KIT
PACK | CUTANEOUS | Status: AC
  Filled 2025-01-09: qty 3

## 2025-01-09 MED ORDER — ONDANSETRON HCL 4 MG/2ML IJ SOLN
INTRAMUSCULAR | Status: DC | PRN
  Administered 2025-01-09: 4 mg via INTRAVENOUS

## 2025-01-09 MED ORDER — PROPOFOL 10 MG/ML IV BOLUS
INTRAVENOUS | Status: DC | PRN
  Administered 2025-01-09: 200 mg via INTRAVENOUS

## 2025-01-09 MED ORDER — HYDROMORPHONE HCL 1 MG/ML IJ SOLN
0.2500 mg | INTRAMUSCULAR | Status: DC | PRN
  Administered 2025-01-09 (×2): 0.5 mg via INTRAVENOUS

## 2025-01-09 MED ORDER — LACTATED RINGERS IV SOLN: INTRAVENOUS | Status: DC

## 2025-01-09 MED ORDER — CELECOXIB 200 MG PO CAPS
200.0000 mg | ORAL_CAPSULE | Freq: Once | ORAL | Status: AC
  Administered 2025-01-09: 200 mg via ORAL
  Filled 2025-01-09: qty 1

## 2025-01-09 MED ORDER — SODIUM CHLORIDE 0.9 % IV SOLN
250.0000 mL | INTRAVENOUS | Status: DC
  Administered 2025-01-09: 250 mL via INTRAVENOUS

## 2025-01-09 MED ORDER — HYDROMORPHONE HCL 1 MG/ML IJ SOLN: 0.5000 mg | INTRAMUSCULAR | Status: DC | PRN

## 2025-01-09 MED ORDER — ORAL CARE MOUTH RINSE: 15.0000 mL | Freq: Once | OROMUCOSAL | Status: AC

## 2025-01-09 MED ORDER — MIDAZOLAM HCL (PF) 2 MG/2ML IJ SOLN
2.0000 mg | Freq: Once | INTRAMUSCULAR | Status: AC | PRN
  Administered 2025-01-09: 2 mg via INTRAVENOUS
  Filled 2025-01-09: qty 2

## 2025-01-09 MED ORDER — SODIUM CHLORIDE 0.9% FLUSH
3.0000 mL | Freq: Two times a day (BID) | INTRAVENOUS | Status: DC
  Administered 2025-01-09: 3 mL via INTRAVENOUS

## 2025-01-09 MED ORDER — HYDROMORPHONE HCL 1 MG/ML IJ SOLN
INTRAMUSCULAR | Status: AC
  Filled 2025-01-09: qty 1

## 2025-01-09 MED ORDER — LACTATED RINGERS IV SOLN: INTRAVENOUS | Status: DC | PRN

## 2025-01-09 MED ORDER — PROPOFOL 10 MG/ML IV BOLUS
INTRAVENOUS | Status: AC
  Filled 2025-01-09: qty 20

## 2025-01-09 MED ORDER — GABAPENTIN 400 MG PO CAPS
800.0000 mg | ORAL_CAPSULE | Freq: Three times a day (TID) | ORAL | Status: DC
  Administered 2025-01-09: 800 mg via ORAL
  Filled 2025-01-09: qty 2

## 2025-01-09 MED ORDER — CHLORHEXIDINE GLUCONATE 4 % EX SOLN: 1.0000 | CUTANEOUS | 1 refills | Status: AC

## 2025-01-09 MED ORDER — ACETAMINOPHEN 325 MG PO TABS: 650.0000 mg | ORAL_TABLET | ORAL | Status: DC | PRN

## 2025-01-09 MED ORDER — FENTANYL CITRATE (PF) 100 MCG/2ML IJ SOLN
25.0000 ug | INTRAMUSCULAR | Status: DC | PRN
  Administered 2025-01-09 (×3): 50 ug via INTRAVENOUS

## 2025-01-09 SURGICAL SUPPLY — 42 items
BAG COUNTER SPONGE SURGICOUNT (BAG) ×1 IMPLANT
BAND RUBBER 3X1/6 STRL (MISCELLANEOUS) ×2 IMPLANT
BENZOIN TINCTURE PRP APPL 2/3 (GAUZE/BANDAGES/DRESSINGS) ×1 IMPLANT
BLADE CLIPPER SURG (BLADE) IMPLANT
BLADE SURG 11 STRL SS (BLADE) ×1 IMPLANT
BUR CUTTER 7.0 ROUND (BURR) ×1 IMPLANT
BUR MATCHSTICK NEURO 3.0 LAGG (BURR) ×1 IMPLANT
CANISTER SUCTION 3000ML PPV (SUCTIONS) ×1 IMPLANT
DERMABOND ADVANCED .7 DNX12 (GAUZE/BANDAGES/DRESSINGS) ×1 IMPLANT
DRAPE HALF SHEET 40X57 (DRAPES) IMPLANT
DRAPE LAPAROTOMY 100X72X124 (DRAPES) ×1 IMPLANT
DRAPE MICROSCOPE LEICA (MISCELLANEOUS) ×1 IMPLANT
DRAPE SURG 17X23 STRL (DRAPES) ×1 IMPLANT
DRSG OPSITE POSTOP 4X6 (GAUZE/BANDAGES/DRESSINGS) IMPLANT
DURAPREP 26ML APPLICATOR (WOUND CARE) ×1 IMPLANT
ELECTRODE REM PT RTRN 9FT ADLT (ELECTROSURGICAL) ×1 IMPLANT
GAUZE 4X4 16PLY ~~LOC~~+RFID DBL (SPONGE) IMPLANT
GAUZE SPONGE 4X4 12PLY STRL (GAUZE/BANDAGES/DRESSINGS) ×1 IMPLANT
GLOVE BIO SURGEON STRL SZ7 (GLOVE) IMPLANT
GLOVE BIO SURGEON STRL SZ8 (GLOVE) ×1 IMPLANT
GLOVE BIOGEL PI IND STRL 7.0 (GLOVE) IMPLANT
GLOVE BIOGEL PI IND STRL 7.5 (GLOVE) IMPLANT
GLOVE INDICATOR 8.5 STRL (GLOVE) ×2 IMPLANT
GLOVE SURG SS PI 6.5 STRL IVOR (GLOVE) IMPLANT
GOWN STRL REUS W/ TWL LRG LVL3 (GOWN DISPOSABLE) ×1 IMPLANT
GOWN STRL REUS W/ TWL XL LVL3 (GOWN DISPOSABLE) ×2 IMPLANT
GOWN STRL REUS W/TWL 2XL LVL3 (GOWN DISPOSABLE) IMPLANT
KIT BASIN OR (CUSTOM PROCEDURE TRAY) ×1 IMPLANT
KIT TURNOVER KIT B (KITS) ×1 IMPLANT
NEEDLE HYPO 22X1.5 SAFETY MO (MISCELLANEOUS) ×1 IMPLANT
NEEDLE SPNL 22GX3.5 QUINCKE BK (NEEDLE) ×1 IMPLANT
PACK LAMINECTOMY NEURO (CUSTOM PROCEDURE TRAY) ×1 IMPLANT
SOLN 0.9% NACL POUR BTL 1000ML (IV SOLUTION) ×1 IMPLANT
SOLN STERILE WATER BTL 1000 ML (IV SOLUTION) ×1 IMPLANT
SPIKE FLUID TRANSFER (MISCELLANEOUS) ×1 IMPLANT
SPONGE SURGIFOAM ABS GEL SZ50 (HEMOSTASIS) ×1 IMPLANT
STRIP CLOSURE SKIN 1/2X4 (GAUZE/BANDAGES/DRESSINGS) ×1 IMPLANT
SUT VIC AB 0 CT1 18XCR BRD8 (SUTURE) ×1 IMPLANT
SUT VIC AB 2-0 CT1 18 (SUTURE) ×1 IMPLANT
SUT VIC AB 4-0 PS2 27 (SUTURE) ×1 IMPLANT
TOWEL GREEN STERILE (TOWEL DISPOSABLE) ×1 IMPLANT
TOWEL GREEN STERILE FF (TOWEL DISPOSABLE) ×1 IMPLANT

## 2025-01-09 NOTE — Progress Notes (Signed)
 Patient discharged home per order. Patient alert and oriented, pain medication given prior to discharged. Patient stated understanding of instructions given. Patient has an appointment with Dr. Onetha in 2 Weeks

## 2025-01-09 NOTE — Plan of Care (Signed)

## 2025-01-09 NOTE — Plan of Care (Signed)
 " Problem: Education: Goal: Knowledge of General Education information will improve Description: Including pain rating scale, medication(s)/side effects and non-pharmacologic comfort measures 01/09/2025 1943 by Sherleen Flor, RN Outcome: Completed/Met 01/09/2025 1658 by Sherleen Flor, RN Outcome: Progressing   Problem: Health Behavior/Discharge Planning: Goal: Ability to manage health-related needs will improve 01/09/2025 1943 by Sherleen Flor, RN Outcome: Completed/Met 01/09/2025 1658 by Sherleen Flor, RN Outcome: Progressing   Problem: Clinical Measurements: Goal: Ability to maintain clinical measurements within normal limits will improve 01/09/2025 1943 by Sherleen Flor, RN Outcome: Completed/Met 01/09/2025 1658 by Sherleen Flor, RN Outcome: Progressing Goal: Will remain free from infection 01/09/2025 1943 by Sherleen Flor, RN Outcome: Completed/Met 01/09/2025 1658 by Sherleen Flor, RN Outcome: Progressing Goal: Diagnostic test results will improve 01/09/2025 1943 by Sherleen Flor, RN Outcome: Completed/Met 01/09/2025 1658 by Sherleen Flor, RN Outcome: Progressing Goal: Respiratory complications will improve 01/09/2025 1943 by Sherleen Flor, RN Outcome: Completed/Met 01/09/2025 1658 by Sherleen Flor, RN Outcome: Progressing Goal: Cardiovascular complication will be avoided 01/09/2025 1943 by Sherleen Flor, RN Outcome: Completed/Met 01/09/2025 1658 by Sherleen Flor, RN Outcome: Progressing   Problem: Activity: Goal: Risk for activity intolerance will decrease 01/09/2025 1943 by Sherleen Flor, RN Outcome: Completed/Met 01/09/2025 1658 by Sherleen Flor, RN Outcome: Progressing   Problem: Nutrition: Goal: Adequate nutrition will be maintained 01/09/2025 1943 by Sherleen Flor, RN Outcome: Completed/Met 01/09/2025 1658 by Sherleen Flor, RN Outcome: Progressing   Problem: Coping: Goal: Level of anxiety will decrease 01/09/2025  1943 by Sherleen Flor, RN Outcome: Completed/Met 01/09/2025 1658 by Sherleen Flor, RN Outcome: Progressing   Problem: Elimination: Goal: Will not experience complications related to bowel motility 01/09/2025 1943 by Sherleen Flor, RN Outcome: Completed/Met 01/09/2025 1658 by Sherleen Flor, RN Outcome: Progressing Goal: Will not experience complications related to urinary retention 01/09/2025 1943 by Sherleen Flor, RN Outcome: Completed/Met 01/09/2025 1658 by Sherleen Flor, RN Outcome: Progressing   Problem: Pain Managment: Goal: General experience of comfort will improve and/or be controlled 01/09/2025 1943 by Sherleen Flor, RN Outcome: Completed/Met 01/09/2025 1658 by Sherleen Flor, RN Outcome: Progressing   Problem: Safety: Goal: Ability to remain free from injury will improve 01/09/2025 1943 by Sherleen Flor, RN Outcome: Completed/Met 01/09/2025 1658 by Sherleen Flor, RN Outcome: Progressing   Problem: Skin Integrity: Goal: Risk for impaired skin integrity will decrease 01/09/2025 1943 by Sherleen Flor, RN Outcome: Completed/Met 01/09/2025 1658 by Sherleen Flor, RN Outcome: Progressing   Problem: Education: Goal: Knowledge of the prescribed therapeutic regimen will improve 01/09/2025 1943 by Sherleen Flor, RN Outcome: Completed/Met 01/09/2025 1658 by Sherleen Flor, RN Outcome: Progressing   Problem: Bowel/Gastric: Goal: Gastrointestinal status for postoperative course will improve 01/09/2025 1943 by Sherleen Flor, RN Outcome: Completed/Met 01/09/2025 1658 by Sherleen Flor, RN Outcome: Progressing   Problem: Cardiac: Goal: Ability to maintain an adequate cardiac output 01/09/2025 1943 by Sherleen Flor, RN Outcome: Completed/Met 01/09/2025 1658 by Sherleen Flor, RN Outcome: Progressing Goal: Will show no evidence of cardiac arrhythmias 01/09/2025 1943 by Sherleen Flor, RN Outcome: Completed/Met 01/09/2025 1658 by  Sherleen Flor, RN Outcome: Progressing   Problem: Nutritional: Goal: Will attain and maintain optimal nutritional status 01/09/2025 1943 by Sherleen Flor, RN Outcome: Completed/Met 01/09/2025 1658 by Sherleen Flor, RN Outcome: Progressing   Problem: Neurological: Goal: Will regain or maintain usual level of consciousness 01/09/2025 1943 by Sherleen Flor, RN Outcome: Completed/Met 01/09/2025 1658 by Sherleen Flor, RN Outcome: Progressing   Problem: Clinical Measurements: Goal: Ability to maintain clinical measurements within normal limits 01/09/2025 1943 by Sherleen Flor, RN Outcome: Completed/Met 01/09/2025  1658 by Sherleen Flor, RN Outcome: Progressing Goal: Postoperative complications will be avoided or minimized 01/09/2025 1943 by Sherleen Flor, RN Outcome: Completed/Met 01/09/2025 1658 by Sherleen Flor, RN Outcome: Progressing   Problem: Respiratory: Goal: Will regain and/or maintain adequate ventilation 01/09/2025 1943 by Sherleen Flor, RN Outcome: Completed/Met 01/09/2025 1658 by Sherleen Flor, RN Outcome: Progressing Goal: Respiratory status will improve 01/09/2025 1943 by Sherleen Flor, RN Outcome: Completed/Met 01/09/2025 1658 by Sherleen Flor, RN Outcome: Progressing   Problem: Skin Integrity: Goal: Demonstrates signs of wound healing without infection 01/09/2025 1943 by Sherleen Flor, RN Outcome: Completed/Met 01/09/2025 1658 by Sherleen Flor, RN Outcome: Progressing   Problem: Urinary Elimination: Goal: Will remain free from infection 01/09/2025 1943 by Sherleen Flor, RN Outcome: Completed/Met 01/09/2025 1658 by Sherleen Flor, RN Outcome: Progressing Goal: Ability to achieve and maintain adequate urine output 01/09/2025 1943 by Sherleen Flor, RN Outcome: Completed/Met 01/09/2025 1658 by Sherleen Flor, RN Outcome: Progressing   Problem: Education: Goal: Ability to verbalize activity precautions or  restrictions will improve 01/09/2025 1943 by Sherleen Flor, RN Outcome: Completed/Met 01/09/2025 1658 by Sherleen Flor, RN Outcome: Progressing Goal: Knowledge of the prescribed therapeutic regimen will improve 01/09/2025 1943 by Sherleen Flor, RN Outcome: Completed/Met 01/09/2025 1658 by Sherleen Flor, RN Outcome: Progressing Goal: Understanding of discharge needs will improve 01/09/2025 1943 by Sherleen Flor, RN Outcome: Completed/Met 01/09/2025 1658 by Sherleen Flor, RN Outcome: Progressing   Problem: Activity: Goal: Ability to avoid complications of mobility impairment will improve 01/09/2025 1943 by Sherleen Flor, RN Outcome: Completed/Met 01/09/2025 1658 by Sherleen Flor, RN Outcome: Progressing Goal: Ability to tolerate increased activity will improve 01/09/2025 1943 by Sherleen Flor, RN Outcome: Completed/Met 01/09/2025 1658 by Sherleen Flor, RN Outcome: Progressing Goal: Will remain free from falls 01/09/2025 1943 by Sherleen Flor, RN Outcome: Completed/Met 01/09/2025 1658 by Sherleen Flor, RN Outcome: Progressing   Problem: Bowel/Gastric: Goal: Gastrointestinal status for postoperative course will improve 01/09/2025 1943 by Sherleen Flor, RN Outcome: Completed/Met 01/09/2025 1658 by Sherleen Flor, RN Outcome: Progressing   Problem: Clinical Measurements: Goal: Ability to maintain clinical measurements within normal limits will improve 01/09/2025 1943 by Sherleen Flor, RN Outcome: Completed/Met 01/09/2025 1658 by Sherleen Flor, RN Outcome: Progressing Goal: Postoperative complications will be avoided or minimized 01/09/2025 1943 by Sherleen Flor, RN Outcome: Completed/Met 01/09/2025 1658 by Sherleen Flor, RN Outcome: Progressing Goal: Diagnostic test results will improve 01/09/2025 1943 by Sherleen Flor, RN Outcome: Completed/Met 01/09/2025 1658 by Sherleen Flor, RN Outcome: Progressing   Problem: Pain  Management: Goal: Pain level will decrease 01/09/2025 1943 by Sherleen Flor, RN Outcome: Completed/Met 01/09/2025 1658 by Sherleen Flor, RN Outcome: Progressing   Problem: Skin Integrity: Goal: Will show signs of wound healing 01/09/2025 1943 by Sherleen Flor, RN Outcome: Completed/Met 01/09/2025 1658 by Sherleen Flor, RN Outcome: Progressing   Problem: Health Behavior/Discharge Planning: Goal: Identification of resources available to assist in meeting health care needs will improve 01/09/2025 1943 by Sherleen Flor, RN Outcome: Completed/Met 01/09/2025 1658 by Sherleen Flor, RN Outcome: Progressing   Problem: Bladder/Genitourinary: Goal: Urinary functional status for postoperative course will improve 01/09/2025 1943 by Sherleen Flor, RN Outcome: Completed/Met 01/09/2025 1658 by Sherleen Flor, RN Outcome: Progressing   "

## 2025-01-09 NOTE — Transfer of Care (Signed)
 Immediate Anesthesia Transfer of Care Note  Patient: Rick [name]  Procedure(s) Performed: LUMBAR LAMINECTOMY/FORAMINOTOMY RIGHT LUMBAR FOUR-FIVE WITH SUBLAMINAR DECOMPRESSION (Right: Back)  Patient Location: PACU  Anesthesia Type:General  Level of Consciousness: awake, alert , and oriented  Airway & Oxygen Therapy: Patient Spontanous Breathing and Patient connected to nasal cannula oxygen  Post-op Assessment: Report given to RN and Post -op Vital signs reviewed and stable  Post vital signs: Reviewed and stable  Last Vitals:  Vitals Value Taken Time  BP 127/78 01/09/25 11:46  Temp 35.9 C 01/09/25 11:46  Pulse 55 01/09/25 11:50  Resp 8 01/09/25 11:50  SpO2 100 % 01/09/25 11:50  Vitals shown include unfiled device data.  Last Pain:  Vitals:   01/09/25 1146  TempSrc:   PainSc: 8       Patients Stated Pain Goal: 0 (01/09/25 0749)  Complications: No notable events documented.

## 2025-01-09 NOTE — Discharge Summary (Signed)
 Physician Discharge Summary  Patient ID: Rick [name] MRN: [id number] DOB/AGE: May 20, 1971 54 y.o.  Admit date: 01/09/2025 Discharge date: 01/09/2025  Admission Diagnoses: Lumbar spinal stenosis lateral recess stenosis L4-5 right with right L4 and L5 radiculopathy.     Discharge Diagnoses: same   Discharged Condition: good  Hospital Course: The patient was admitted on 01/09/2025 and taken to the operating room where the patient underwent lumbar laminectomy L4-5 right. The patient tolerated the procedure well and was taken to the recovery room and then to the floor in stable condition. The hospital course was routine. There were no complications. The wound remained clean dry and intact. Pt had appropriate back soreness. No complaints of leg pain or new N/T/W. The patient remained afebrile with stable vital signs, and tolerated a regular diet. The patient continued to increase activities, and pain was well controlled with oral pain medications.   Consults: None  Significant Diagnostic Studies:  Results for orders placed or performed during the hospital encounter of 01/04/25  Surgical pcr screen   Collection Time: 01/04/25  1:36 PM   Specimen: Nasal Mucosa; Nasal Swab  Result Value Ref Range   MRSA, PCR NEGATIVE NEGATIVE   Staphylococcus aureus POSITIVE (A) NEGATIVE  CBC per protocol   Collection Time: 01/04/25  2:00 PM  Result Value Ref Range   WBC 10.6 (H) 4.0 - 10.5 K/uL   RBC 5.01 4.22 - 5.81 MIL/uL   Hemoglobin 17.0 13.0 - 17.0 g/dL   HCT 51.9 60.9 - 47.9 %   MCV 95.8 80.0 - 100.0 fL   MCH 33.9 26.0 - 34.0 pg   MCHC 35.4 30.0 - 36.0 g/dL   RDW 85.9 88.4 - 84.4 %   Platelets 319 150 - 400 K/uL   nRBC 0.0 0.0 - 0.2 %    CT ANGIO CHEST AORTA W/CM & OR WO/CM Result Date: 01/06/2025 EXAM: CTA CHEST AORTA 01/05/2025 03:53:05 PM TECHNIQUE: CTA of the chest was performed after the administration of intravenous contrast. Multiplanar reformatted images are provided for review. MIP  images are provided for review. Automated exposure control, iterative reconstruction, and/or weight based adjustment of the mA/kV was utilized to reduce the radiation dose to as low as reasonably achievable. CONTRAST: 75mL of Omnipaque350. COMPARISON: Cardiac CTA performed 01/07/2023. CLINICAL HISTORY: Aortic valve dilation. FINDINGS: AORTA: Dilated aortic root measuring up to 4.3 cm, previously measuring 4.3 cm. Mid ascending aorta measures up to 3.7 cm. Common origin of the brachiocephalic artery and left common carotid artery. Minimal aortic calcification. No thoracic aortic dissection. MEDIASTINUM: No mediastinal lymphadenopathy. The heart and pericardium demonstrate no acute abnormality. LYMPH NODES: No mediastinal, hilar or axillary lymphadenopathy. LUNGS AND PLEURA: Calcified tiny left lower lobe pulmonary granuloma. No focal consolidation or pulmonary edema. No pleural effusion or pneumothorax. UPPER ABDOMEN: Limited images of the upper abdomen are unremarkable. SOFT TISSUES AND BONES: No acute bone or soft tissue abnormality. IMPRESSION: 1. Unchanged dilated aortic root measuring up to 4.3 cm. Recommend follow up CTA and/or MRA in 1 year for further evaluation. Electronically signed by: Michaeline Blanch MD 01/06/2025 04:03 PM EST RP Workstation: [id number]    Antibiotics:  Anti-infectives (From admission, onward)    Start     Dose/Rate Route Frequency Ordered Stop   01/09/25 1800  ceFAZolin  (ANCEF ) IVPB 2g/100 mL premix        2 g 200 mL/hr over 30 Minutes Intravenous Every 8 hours 01/09/25 1349 01/10/25 0959   01/09/25 0745  ceFAZolin  (ANCEF ) IVPB 2g/100 mL premix  2 g 200 mL/hr over 30 Minutes Intravenous On call to O.R. 01/09/25 0741 01/09/25 1035       Discharge Exam: Blood pressure (!) 92/59, pulse (!) 51, temperature 98.8 F (37.1 C), resp. rate 19, height 5' 9 (1.753 m), weight 72.6 kg, SpO2 98%. Neurologic: Grossly normal Ambulating and voiding well incision cdi   Discharge  Medications:   Allergies as of 01/09/2025       Reactions   Mustard Swelling        Medication List     TAKE these medications    acetaminophen  500 MG tablet Commonly known as: TYLENOL  Take 1,000 mg by mouth every 6 (six) hours as needed for mild pain (pain score 1-3) or moderate pain (pain score 4-6).   amLODipine -olmesartan  10-40 MG tablet Commonly known as: AZOR  TAKE 1 TABLET BY MOUTH DAILY   calcium  carbonate 500 MG chewable tablet Commonly known as: TUMS - dosed in mg elemental calcium  Chew 1 tablet by mouth as needed for indigestion or heartburn.   chlorhexidine  4 % external liquid Commonly known as: HIBICLENS  Apply 15 mLs (1 Application total) topically as directed for 30 doses. Use as directed daily for 5 days every other week for 6 weeks.   chlorthalidone  25 MG tablet Commonly known as: HYGROTON  Take 0.5 tablets (12.5 mg total) by mouth daily.   gabapentin  800 MG tablet Commonly known as: NEURONTIN  Take 800 mg by mouth 3 (three) times daily.   HYDROcodone -acetaminophen  5-325 MG tablet Commonly known as: NORCO/VICODIN Take 1 tablet by mouth every 6 (six) hours as needed for moderate pain (pain score 4-6).   meloxicam  15 MG tablet Commonly known as: MOBIC  TAKE 1 TABLET(15 MG) BY MOUTH DAILY   metoprolol  succinate 25 MG 24 hr tablet Commonly known as: Toprol  XL Take 0.5 tablets (12.5 mg total) by mouth daily.   mupirocin  ointment 2 % Commonly known as: BACTROBAN  Place 1 Application into the nose 2 (two) times daily for 60 doses. Use as directed 2 times daily for 5 days every other week for 6 weeks.   nitroGLYCERIN  0.4 MG SL tablet Commonly known as: NITROSTAT  PLACE 1 TABLET UNDER THE TONGUE EVERY 5 MINUTES AS NEEDED FOR CHEST PAIN AS DIRECTED   rosuvastatin  20 MG tablet Commonly known as: CRESTOR  TAKE 1 TABLET(20 MG) BY MOUTH DAILY   Suboxone  8-2 MG Film Generic drug: Buprenorphine  HCl-Naloxone  HCl Place under the tongue daily.   tiZANidine  4 MG  tablet Commonly known as: ZANAFLEX  Take 1 tablet (4 mg total) by mouth 4 (four) times daily as needed for muscle spasms.        Disposition: home   Final Dx: right lumbar laminectomy L4-5  Discharge Instructions      Remove dressing in 72 hours   Complete by: As directed    Call MD for:   Complete by: As directed    Call MD for:  difficulty breathing, headache or visual disturbances   Complete by: As directed    Call MD for:  hives   Complete by: As directed    Call MD for:  persistant dizziness or light-headedness   Complete by: As directed    Call MD for:  persistant nausea and vomiting   Complete by: As directed    Call MD for:  redness, tenderness, or signs of infection (pain, swelling, redness, odor or green/yellow discharge around incision site)   Complete by: As directed    Call MD for:  severe uncontrolled pain   Complete by:  As directed    Call MD for:  temperature >100.4   Complete by: As directed    Driving Restrictions   Complete by: As directed    No driving for 2 weeks, no riding in the car for 1 week   Increase activity slowly   Complete by: As directed    Lifting restrictions   Complete by: As directed    No lifting more than 8 lbs          Signed: Suzen Lacks Dane Bloch 01/09/2025, 4:47 PM

## 2025-01-09 NOTE — Anesthesia Postprocedure Evaluation (Signed)
"   Anesthesia Post Note  Patient: Rick [name]  Procedure(s) Performed: LUMBAR LAMINECTOMY/FORAMINOTOMY RIGHT LUMBAR FOUR-FIVE WITH SUBLAMINAR DECOMPRESSION (Right: Back)     Patient location during evaluation: PACU Anesthesia Type: General Level of consciousness: awake and alert Pain management: pain level controlled Vital Signs Assessment: post-procedure vital signs reviewed and stable Respiratory status: spontaneous breathing, nonlabored ventilation, respiratory function stable and patient connected to nasal cannula oxygen Cardiovascular status: blood pressure returned to baseline and stable Postop Assessment: no apparent nausea or vomiting Anesthetic complications: no   There were no known notable events for this encounter.  Last Vitals:  Vitals:   01/09/25 1330 01/09/25 1350  BP: 130/84 138/78  Pulse: 61 (!) 53  Resp: 10 16  Temp: (!) 36.4 C 36.7 C  SpO2: 99% 100%    Last Pain:  Vitals:   01/09/25 1350  TempSrc: Oral  PainSc:                  Rick [name]      "

## 2025-01-09 NOTE — Op Note (Signed)
 Reoperative diagnosis: Lumbar spinal stenosis lateral recess stenosis L4-5 right with right L4 and L5 radiculopathy.  Postoperative diagnosis: Same.  Procedure: Lumbar laminectomy partial facetectomy and foraminotomies on the right at L4-5 with microscopic partial facetectomy microscopic foraminotomies and decompression of the L4 and L5 nerve root.  Surgeon: Arley helling.  Assistant: Suzen Click.  Anesthesia: General  EBL: Minimal  HPI: 54 year old woman progressive worsening back and right hip and leg pain workup revealed severe spinal stenosis lateral recess stenosis at L4-5 on the right due to the patient's progression of clinical syndrome imaging findings failed conservative treatment I recommended decompressive laminectomy at L4-5 on the right with microscopic partial facetectomy and foraminotomies at the L4-L5 nerve roots.  I extensively went over the risks and benefits of the operation with the patient as well as perioperative course expectations of outcome and alternatives to surgery and he understood and agreed to proceed forward.  Operative procedure: Patient was brought into the OR was induced under general anesthesia positioned prone the Wilson frame his back was prepped and draped in routine sterile fashion.  Preoperative x-ray to localize the appropriate level.  So after infiltration of 10 cc lidocaine  with epi midline incision was made and Bovie electrocautery was used to take down the subcutaneous tissue and subperiosteal dissection was carried lamina of L4 and L5 on the right.  Interoperative x-ray confirmed defecation appropriate level.  So utilizing a high-speed drill the inferior half of the L4 lamina and medial facet complex and superior third of the L5 lamina was drilled down laminotomy was begun with a 2 and 3 Miller Kerrison punch identifying the ligament flavum which was removed in piecemeal fashion.  The operating microscope was draped and brought into the field under  microscopic lamination under biting of the medial facet allowed identification of the significantly large osteophyte projecting medially and significantly hypertrophied ligamentum flavum.  This was all teased off of the dura and removed in piecemeal fashion decompressing the lateral canal identifying the L5 pedicle unroofing the L5 foramen and further marching superiorly I was able to identify and palpate the inferior aspect of the L4 nerve root and L4 pedicle.  I decompressed underneath the superior and inferior aspect of the facet joint decompressing both the L4 and L5 foramen inspected the space to space was felt not to be significantly bulging so I left it alone.  At the end the decompression was no further stenosis on thecal sac easily excepting a coronary dilator out both the L4 and L5 foramen with no resistance.  The wound was then copiously agreements because hemostasis was maintained Gelfoam was over in top of the dura and the muscle and fascia approximate layers with after Vicryl skin was closed running 4 subcuticular Dermabond benzoin Steri-Strips and a sterile dressing was applied patient to cover room in stable condition.  At the end the case all sponge counts were correct.

## 2025-01-09 NOTE — H&P (Signed)
 Rick [name] is an 54 y.o. male.   Chief Complaint: Back and right leg pain HPI: 54 year old gentleman with back and right leg pain workup revealed lateral recess stenosis at L4-5 on the right due to patient's progression of clinical syndrome imaging findings failed conservative treatment I recommended decompressive laminectomy at L4-5 on the right I extensively went over the risks and benefits of the operation with the patient as well as perioperative course expectations of outcome and alternatives to surgery and he understands agrees to proceed forward.  Past Medical History:  Diagnosis Date   Arthritis    Back pain    BOILS, RECURRENT 04/13/2007   Qualifier: Diagnosis of  By: Jeanelle MD, Marshall     Depression    GASTROESOPHAGEAL REFLUX, NO ESOPHAGITIS 02/25/2007   Qualifier: Diagnosis of  By: Damien Folks     Hypertension    MIGRAINE, UNSPEC., W/O INTRACTABLE MIGRAINE 02/25/2007   Qualifier: Diagnosis of  By: Damien Folks     Neck pain    OSTEOARTHRITIS OF SPINE, NOS 02/25/2007   Qualifier: Diagnosis of  By: Damien Folks     PAC (premature atrial contraction) 01/28/2021   Palpitations 10/02/2020    Past Surgical History:  Procedure Laterality Date   LUMBAR LAMINECTOMY/DECOMPRESSION MICRODISCECTOMY Right 01/03/2022   Procedure: Microdiscectomy - L2-L3 - right;  Surgeon: Onetha Kuba, MD;  Location: [facility] OR;  Service: Neurosurgery;  Laterality: Right;  3C   Meniscus Right 2023   repair   NECK SURGERY     x 3   TONSILLECTOMY Bilateral 1976    Family History  Problem Relation Age of Onset   Cancer Mother    Cancer Father    Cancer Maternal Grandfather    Social History:  reports that he has quit smoking. His smoking use included cigarettes. He has a 3.5 pack-year smoking history. He has been exposed to tobacco smoke. He has never used smokeless tobacco. He reports that he does not currently use alcohol. He reports that he does not use drugs.  Allergies:  Allergies[1]  Medications Prior to Admission  Medication Sig Dispense Refill   amLODipine -olmesartan  (AZOR ) 10-40 MG tablet TAKE 1 TABLET BY MOUTH DAILY 90 tablet 1   calcium  carbonate (TUMS - DOSED IN MG ELEMENTAL CALCIUM ) 500 MG chewable tablet Chew 1 tablet by mouth as needed for indigestion or heartburn.     chlorthalidone  (HYGROTON ) 25 MG tablet Take 0.5 tablets (12.5 mg total) by mouth daily. 45 tablet 3   gabapentin  (NEURONTIN ) 800 MG tablet Take 800 mg by mouth 3 (three) times daily.     metoprolol  succinate (TOPROL  XL) 25 MG 24 hr tablet Take 0.5 tablets (12.5 mg total) by mouth daily. 45 tablet 3   rosuvastatin  (CRESTOR ) 20 MG tablet TAKE 1 TABLET(20 MG) BY MOUTH DAILY 90 tablet 3   SUBOXONE  8-2 MG FILM Place under the tongue daily.     tiZANidine  (ZANAFLEX ) 4 MG tablet Take 4 mg by mouth 4 (four) times daily as needed for muscle spasms.     acetaminophen  (TYLENOL ) 500 MG tablet Take 1,000 mg by mouth every 6 (six) hours as needed for mild pain (pain score 1-3) or moderate pain (pain score 4-6).     meloxicam  (MOBIC ) 15 MG tablet TAKE 1 TABLET(15 MG) BY MOUTH DAILY 30 tablet 0   nitroGLYCERIN  (NITROSTAT ) 0.4 MG SL tablet PLACE 1 TABLET UNDER THE TONGUE EVERY 5 MINUTES AS NEEDED FOR CHEST PAIN AS DIRECTED 25 tablet 1    No results found  for this or any previous visit (from the past 48 hours). No results found.  Review of Systems  Musculoskeletal:  Positive for back pain.  Neurological:  Positive for numbness.    Blood pressure 116/75, pulse (!) 56, temperature 97.6 F (36.4 C), temperature source Oral, resp. rate 18, height 5' 9 (1.753 m), weight 72.6 kg, SpO2 94%. Physical Exam HENT:     Head: Normocephalic.     Right Ear: Tympanic membrane normal.     Nose: Nose normal.     Mouth/Throat:     Mouth: Mucous membranes are moist.  Cardiovascular:     Rate and Rhythm: Normal rate.  Pulmonary:     Effort: Pulmonary effort is normal.  Abdominal:     General: Abdomen is  flat.  Musculoskeletal:        General: Normal range of motion.     Cervical back: Normal range of motion.  Skin:    General: Skin is warm.  Neurological:     Mental Status: He is alert.     Comments: Strength is 5/5 to all iliopsoas, quads, hamstrings, gastrocs, ant tibialis, and EHL.      Assessment/Plan 54 year old presents for decompressive laminectomy L4-5 on the right  Arley SHAUNNA Helling, MD 01/09/2025, 9:34 AM       [1]  Allergies Allergen Reactions   Mustard Swelling

## 2025-01-09 NOTE — Anesthesia Procedure Notes (Addendum)
 Procedure Name: Intubation Date/Time: 01/09/2025 10:04 AM  Performed by: Scherrie Mast, CRNAPre-anesthesia Checklist: Patient identified, Emergency Drugs available, Suction available and Patient being monitored Patient Re-evaluated:Patient Re-evaluated prior to induction Oxygen Delivery Method: Circle System Utilized Preoxygenation: Pre-oxygenation with 100% oxygen Induction Type: IV induction Ventilation: Mask ventilation without difficulty Laryngoscope Size: Mac, Glidescope and 4 Grade View: Grade I Tube type: Oral Tube size: 7.0 mm Number of attempts: 1 Airway Equipment and Method: Stylet and Oral airway Placement Confirmation: ETT inserted through vocal cords under direct vision, positive ETCO2 and breath sounds checked- equal and bilateral Secured at: 22 cm Tube secured with: Tape Dental Injury: Teeth and Oropharynx as per pre-operative assessment

## 2025-01-09 NOTE — Evaluation (Signed)
 Occupational Therapy Evaluation Patient Details Name: Rick [name] MRN: [id number] DOB: February 19, 1971 Today's Date: 01/09/2025   History of Present Illness   54 y.o. male s.p L 4-5 laminectomy on the R. PMH: lumbar spinal surgery, neck surgery x3, HTN, depression, osteoarthritis     Clinical Impressions PTA, pt lived with significant other and reports being independent. Upon eval, pt mod I for BADL. Pt educated and demonstrating use of compensatory techniques for bed mobility, LB ADL, grooming, toileting, car transfers, stair training, and shower transfers within precautions. Reviewed recommendations for mobility vs rest. All education provided and questions answered. Recommending discharge home with no OT follow up at this time. OT to sign off. Please re-consult if change in status.        If plan is discharge home, recommend the following:   Other (comment);Assist for transportation;Assistance with cooking/housework (on pt request)     Functional Status Assessment   Patient has had a recent decline in their functional status and demonstrates the ability to make significant improvements in function in a reasonable and predictable amount of time.     Equipment Recommendations   None recommended by OT     Recommendations for Other Services         Precautions/Restrictions   Precautions Precautions: Back Precaution Booklet Issued: Yes (comment) Recall of Precautions/Restrictions: Intact Restrictions Weight Bearing Restrictions Per Provider Order: No     Mobility Bed Mobility Overal bed mobility: Modified Independent                  Transfers Overall transfer level: Modified independent                        Balance Overall balance assessment: Modified Independent                                         ADL either performed or assessed with clinical judgement   ADL Overall ADL's : Modified independent                                        General ADL Comments: incr time     Vision Baseline Vision/History: 1 Wears glasses Patient Visual Report: No change from baseline Vision Assessment?: No apparent visual deficits     Perception Perception: Within Functional Limits       Praxis Praxis: WFL       Pertinent Vitals/Pain Pain Assessment Pain Assessment: Faces Faces Pain Scale: Hurts Rens more Pain Location: operative site Pain Descriptors / Indicators: Sore Pain Intervention(s): Monitored during session     Extremity/Trunk Assessment Upper Extremity Assessment Upper Extremity Assessment: Overall WFL for tasks assessed   Lower Extremity Assessment Lower Extremity Assessment: Overall WFL for tasks assessed   Cervical / Trunk Assessment Cervical / Trunk Assessment: Back Surgery   Communication Communication Communication: No apparent difficulties   Cognition Arousal: Alert Behavior During Therapy: WFL for tasks assessed/performed Cognition: No apparent impairments                               Following commands: Intact       Cueing  General Comments          Exercises     Shoulder  Instructions      Home Living Family/patient expects to be discharged to:: Private residence Living Arrangements: Spouse/significant other Available Help at Discharge: Family;Available 24 hours/day Type of Home: House Home Access: Stairs to enter [employer] of Steps: 2 (vs ramp) Entrance Stairs-Rails: Right;Left Home Layout: One level     Bathroom Shower/Tub: Chief Strategy Officer: Handicapped height     Home Equipment: Rollator (4 wheels);Rolling Walker (2 wheels);Cane - single point;BSC/3in1;Shower seat;Grab bars - tub/shower          Prior Functioning/Environment Prior Level of Function : Independent/Modified Independent             Mobility Comments: no AD ADLs Comments: works part time engineer, structural    OT Problem List: Pain;Decreased knowledge of precautions   OT Treatment/Interventions:        OT Goals(Current goals can be found in the care plan section)   Acute Rehab OT Goals Patient Stated Goal: go home OT Goal Formulation: With patient Time For Goal Achievement: 01/23/25 Potential to Achieve Goals: Good   OT Frequency:       Co-evaluation              AM-PAC OT 6 Clicks Daily Activity     Outcome Measure Help from another person eating meals?: None Help from another person taking care of personal grooming?: None Help from another person toileting, which includes using toliet, bedpan, or urinal?: None Help from another person bathing (including washing, rinsing, drying)?: None Help from another person to put on and taking off regular upper body clothing?: None Help from another person to put on and taking off regular lower body clothing?: None 6 Click Score: 24   End of Session Nurse Communication: Mobility status  Activity Tolerance: Patient tolerated treatment well Patient left: in bed;with call bell/phone within reach  OT Visit Diagnosis: Pain Pain - part of body:  (back)                Time: 8341-8285 OT Time Calculation (min): 16 min Charges:  OT General Charges $OT Visit: 1 Visit OT Evaluation $OT Eval Low Complexity: 1 Low  Elma JONETTA Lebron FREDERICK, OTR/L [facility] Acute Rehabilitation Office: [phone]   Elma JONETTA Lebron 01/09/2025, 5:22 PM

## 2025-01-10 ENCOUNTER — Encounter (HOSPITAL_COMMUNITY): Payer: Self-pay | Admitting: Neurosurgery

## 2025-02-27 ENCOUNTER — Ambulatory Visit: Payer: MEDICAID | Admitting: Nurse Practitioner
